# Patient Record
Sex: Female | Born: 1935 | Race: White | Hispanic: No | Marital: Married
Health system: Southern US, Community
[De-identification: ages and names within clinical notes are randomized; demographics above are authoritative.]

## PROBLEM LIST (undated history)

## (undated) DIAGNOSIS — E559 Vitamin D deficiency, unspecified: Secondary | ICD-10-CM

## (undated) DIAGNOSIS — R32 Unspecified urinary incontinence: Secondary | ICD-10-CM

## (undated) DIAGNOSIS — T7840XA Allergy, unspecified, initial encounter: Secondary | ICD-10-CM

## (undated) DIAGNOSIS — E538 Deficiency of other specified B group vitamins: Secondary | ICD-10-CM

## (undated) DIAGNOSIS — F419 Anxiety disorder, unspecified: Secondary | ICD-10-CM

## (undated) DIAGNOSIS — F329 Major depressive disorder, single episode, unspecified: Secondary | ICD-10-CM

## (undated) DIAGNOSIS — I10 Essential (primary) hypertension: Secondary | ICD-10-CM

## (undated) DIAGNOSIS — F32A Depression, unspecified: Secondary | ICD-10-CM

## (undated) DIAGNOSIS — W19XXXA Unspecified fall, initial encounter: Secondary | ICD-10-CM

## (undated) HISTORY — DX: Allergy, unspecified, initial encounter: T78.40XA

## (undated) HISTORY — DX: Vitamin D deficiency, unspecified: E55.9

## (undated) HISTORY — DX: Deficiency of other specified B group vitamins: E53.8

## (undated) HISTORY — DX: Anxiety disorder, unspecified: F41.9

## (undated) HISTORY — DX: Major depressive disorder, single episode, unspecified: F32.9

## (undated) HISTORY — DX: Essential (primary) hypertension: I10

## (undated) HISTORY — DX: Unspecified fall, initial encounter: W19.XXXA

## (undated) HISTORY — DX: Depression, unspecified: F32.A

## (undated) HISTORY — DX: Unspecified urinary incontinence: R32

---

## 2002-12-25 ENCOUNTER — Other Ambulatory Visit: Payer: Self-pay

## 2004-03-25 ENCOUNTER — Ambulatory Visit: Payer: Self-pay | Admitting: Physician Assistant

## 2004-06-10 ENCOUNTER — Ambulatory Visit: Payer: Self-pay | Admitting: Internal Medicine

## 2005-06-23 ENCOUNTER — Ambulatory Visit: Payer: Self-pay | Admitting: Internal Medicine

## 2005-06-29 ENCOUNTER — Inpatient Hospital Stay: Payer: Self-pay | Admitting: General Practice

## 2005-06-29 ENCOUNTER — Other Ambulatory Visit: Payer: Self-pay

## 2005-07-02 ENCOUNTER — Other Ambulatory Visit: Payer: Self-pay

## 2005-07-16 ENCOUNTER — Emergency Department: Payer: Self-pay | Admitting: Emergency Medicine

## 2005-07-16 ENCOUNTER — Other Ambulatory Visit: Payer: Self-pay

## 2006-06-26 ENCOUNTER — Ambulatory Visit: Payer: Self-pay | Admitting: Internal Medicine

## 2007-06-28 ENCOUNTER — Ambulatory Visit: Payer: Self-pay | Admitting: Internal Medicine

## 2008-03-30 ENCOUNTER — Inpatient Hospital Stay: Payer: Self-pay | Admitting: Internal Medicine

## 2008-04-07 ENCOUNTER — Encounter: Payer: Self-pay | Admitting: Internal Medicine

## 2008-04-14 ENCOUNTER — Encounter: Payer: Self-pay | Admitting: Internal Medicine

## 2009-06-28 ENCOUNTER — Emergency Department: Payer: Self-pay | Admitting: Unknown Physician Specialty

## 2010-08-18 ENCOUNTER — Emergency Department: Payer: Self-pay | Admitting: Internal Medicine

## 2011-02-15 ENCOUNTER — Emergency Department: Payer: Self-pay | Admitting: Internal Medicine

## 2011-02-15 LAB — URINALYSIS, COMPLETE
Blood: NEGATIVE
Nitrite: NEGATIVE
Ph: 5 (ref 4.5–8.0)
Protein: NEGATIVE
RBC,UR: 3 /HPF (ref 0–5)
Specific Gravity: 1.021 (ref 1.003–1.030)
Squamous Epithelial: 1
WBC UR: 3 /HPF (ref 0–5)

## 2011-02-15 LAB — CK TOTAL AND CKMB (NOT AT ARMC): CK-MB: <0.5 ng/mL — ABNORMAL LOW (ref 0.5–3.6)

## 2011-02-15 LAB — COMPREHENSIVE METABOLIC PANEL
Albumin: 3.3 g/dL — ABNORMAL LOW (ref 3.4–5.0)
Alkaline Phosphatase: 71 U/L (ref 50–136)
BUN: 13 mg/dL (ref 7–18)
Calcium, Total: 8 mg/dL — ABNORMAL LOW (ref 8.5–10.1)
EGFR (Non-African Amer.): >60
Glucose: 111 mg/dL — ABNORMAL HIGH (ref 65–99)
Osmolality: 284 (ref 275–301)
Potassium: 3.8 mmol/L (ref 3.5–5.1)
Sodium: 142 mmol/L (ref 136–145)
Total Protein: 6.1 g/dL — ABNORMAL LOW (ref 6.4–8.2)

## 2011-02-15 LAB — CBC
HCT: 35.1 % (ref 35.0–47.0)
HGB: 11.9 g/dL — ABNORMAL LOW (ref 12.0–16.0)
MCHC: 33.9 g/dL (ref 32.0–36.0)
Platelet: 122 10*3/uL — ABNORMAL LOW (ref 150–440)
RBC: 3.55 10*6/uL — ABNORMAL LOW (ref 3.80–5.20)
RDW: 13.6 % (ref 11.5–14.5)

## 2011-02-20 LAB — CULTURE, BLOOD (SINGLE)

## 2011-08-15 ENCOUNTER — Ambulatory Visit: Payer: Self-pay | Admitting: Internal Medicine

## 2011-08-16 ENCOUNTER — Inpatient Hospital Stay: Payer: Self-pay | Admitting: Internal Medicine

## 2011-08-16 LAB — COMPREHENSIVE METABOLIC PANEL
Albumin: 3 g/dL — ABNORMAL LOW (ref 3.4–5.0)
Alkaline Phosphatase: 86 U/L (ref 50–136)
Anion Gap: 8 (ref 7–16)
BUN: 23 mg/dL — ABNORMAL HIGH (ref 7–18)
Bilirubin,Total: 0.8 mg/dL (ref 0.2–1.0)
Calcium, Total: 8.7 mg/dL (ref 8.5–10.1)
Chloride: 98 mmol/L (ref 98–107)
Co2: 26 mmol/L (ref 21–32)
Creatinine: 0.93 mg/dL (ref 0.60–1.30)
EGFR (African American): >60
EGFR (Non-African Amer.): 54 — ABNORMAL LOW
Glucose: 111 mg/dL — ABNORMAL HIGH (ref 65–99)
Osmolality: 269 (ref 275–301)
Potassium: 4.2 mmol/L (ref 3.5–5.1)
SGOT(AST): 13 U/L — ABNORMAL LOW (ref 15–37)
SGPT (ALT): 13 U/L
Sodium: 132 mmol/L — ABNORMAL LOW (ref 136–145)
Total Protein: 6.6 g/dL (ref 6.4–8.2)

## 2011-08-16 LAB — URINALYSIS, COMPLETE
Bacteria: NONE SEEN
Bilirubin,UR: NEGATIVE
Glucose,UR: NEGATIVE mg/dL (ref 0–75)
Granular Cast: 13
Nitrite: NEGATIVE
Ph: 5 (ref 4.5–8.0)
Protein: 30
RBC,UR: 6 /HPF (ref 0–5)
Specific Gravity: 1.018 (ref 1.003–1.030)
Squamous Epithelial: 1
WBC UR: 10 /HPF (ref 0–5)

## 2011-08-16 LAB — CBC
HCT: 38.1 % (ref 35.0–47.0)
HGB: 12.8 g/dL (ref 12.0–16.0)
MCH: 33.6 pg (ref 26.0–34.0)
MCHC: 33.6 g/dL (ref 32.0–36.0)
MCV: 100 fL (ref 80–100)
Platelet: 159 10*3/uL (ref 150–440)
RBC: 3.82 10*6/uL (ref 3.80–5.20)
RDW: 13.9 % (ref 11.5–14.5)
WBC: 16.5 10*3/uL — ABNORMAL HIGH (ref 3.6–11.0)

## 2011-08-16 LAB — TROPONIN I: Troponin-I: 0.21 ng/mL — ABNORMAL HIGH

## 2011-08-17 LAB — COMPREHENSIVE METABOLIC PANEL
Alkaline Phosphatase: 77 U/L (ref 50–136)
Bilirubin,Total: 0.4 mg/dL (ref 0.2–1.0)
Calcium, Total: 8.4 mg/dL — ABNORMAL LOW (ref 8.5–10.1)
Chloride: 99 mmol/L (ref 98–107)
Co2: 27 mmol/L (ref 21–32)
Creatinine: 0.94 mg/dL (ref 0.60–1.30)
EGFR (African American): >60
Glucose: 122 mg/dL — ABNORMAL HIGH (ref 65–99)
Osmolality: 276 (ref 275–301)
Potassium: 4 mmol/L (ref 3.5–5.1)
SGOT(AST): 15 U/L (ref 15–37)
SGPT (ALT): 12 U/L
Sodium: 134 mmol/L — ABNORMAL LOW (ref 136–145)

## 2011-08-17 LAB — CBC WITH DIFFERENTIAL/PLATELET
Basophil %: 0.1 %
Eosinophil #: 0 10*3/uL (ref 0.0–0.7)
Eosinophil %: 0 %
HCT: 36 % (ref 35.0–47.0)
HGB: 12.1 g/dL (ref 12.0–16.0)
Lymphocyte #: 0.7 10*3/uL — ABNORMAL LOW (ref 1.0–3.6)
MCV: 99 fL (ref 80–100)
Monocyte %: 5.1 %
Neutrophil #: 11.9 10*3/uL — ABNORMAL HIGH (ref 1.4–6.5)
Platelet: 151 10*3/uL (ref 150–440)
RBC: 3.63 10*6/uL — ABNORMAL LOW (ref 3.80–5.20)
WBC: 13.2 10*3/uL — ABNORMAL HIGH (ref 3.6–11.0)

## 2011-08-22 LAB — CULTURE, BLOOD (SINGLE)

## 2011-09-15 ENCOUNTER — Ambulatory Visit: Payer: Self-pay | Admitting: Internal Medicine

## 2011-12-07 ENCOUNTER — Emergency Department: Payer: Self-pay | Admitting: Emergency Medicine

## 2012-05-18 ENCOUNTER — Encounter: Payer: Self-pay | Admitting: Nurse Practitioner

## 2012-05-18 DIAGNOSIS — F411 Generalized anxiety disorder: Secondary | ICD-10-CM

## 2012-09-05 ENCOUNTER — Non-Acute Institutional Stay (SKILLED_NURSING_FACILITY): Payer: Medicare Other | Admitting: Adult Health

## 2012-09-05 DIAGNOSIS — K219 Gastro-esophageal reflux disease without esophagitis: Secondary | ICD-10-CM

## 2012-09-05 DIAGNOSIS — J309 Allergic rhinitis, unspecified: Secondary | ICD-10-CM

## 2012-09-05 DIAGNOSIS — I10 Essential (primary) hypertension: Secondary | ICD-10-CM

## 2012-09-05 DIAGNOSIS — F329 Major depressive disorder, single episode, unspecified: Secondary | ICD-10-CM

## 2012-09-05 DIAGNOSIS — F341 Dysthymic disorder: Secondary | ICD-10-CM

## 2012-09-05 DIAGNOSIS — R32 Unspecified urinary incontinence: Secondary | ICD-10-CM

## 2012-09-05 DIAGNOSIS — E538 Deficiency of other specified B group vitamins: Secondary | ICD-10-CM

## 2012-09-05 DIAGNOSIS — K5900 Constipation, unspecified: Secondary | ICD-10-CM

## 2012-09-05 DIAGNOSIS — E559 Vitamin D deficiency, unspecified: Secondary | ICD-10-CM

## 2012-09-05 DIAGNOSIS — W19XXXS Unspecified fall, sequela: Secondary | ICD-10-CM

## 2012-09-07 ENCOUNTER — Encounter: Payer: Self-pay | Admitting: Adult Health

## 2012-09-07 DIAGNOSIS — J309 Allergic rhinitis, unspecified: Secondary | ICD-10-CM | POA: Insufficient documentation

## 2012-09-07 DIAGNOSIS — W19XXXA Unspecified fall, initial encounter: Secondary | ICD-10-CM | POA: Insufficient documentation

## 2012-09-07 DIAGNOSIS — E538 Deficiency of other specified B group vitamins: Secondary | ICD-10-CM | POA: Insufficient documentation

## 2012-09-07 DIAGNOSIS — K219 Gastro-esophageal reflux disease without esophagitis: Secondary | ICD-10-CM | POA: Insufficient documentation

## 2012-09-07 DIAGNOSIS — E559 Vitamin D deficiency, unspecified: Secondary | ICD-10-CM | POA: Insufficient documentation

## 2012-09-07 DIAGNOSIS — K5900 Constipation, unspecified: Secondary | ICD-10-CM | POA: Insufficient documentation

## 2012-09-07 DIAGNOSIS — I10 Essential (primary) hypertension: Secondary | ICD-10-CM | POA: Insufficient documentation

## 2012-09-07 DIAGNOSIS — F419 Anxiety disorder, unspecified: Secondary | ICD-10-CM | POA: Insufficient documentation

## 2012-09-07 DIAGNOSIS — R32 Unspecified urinary incontinence: Secondary | ICD-10-CM | POA: Insufficient documentation

## 2012-09-07 MED ORDER — VITAMIN D 50 MCG (2000 UT) PO CAPS: 2000.0000 [IU] | ORAL_CAPSULE | Freq: Every day | ORAL | Status: DC

## 2012-09-07 MED ORDER — CYANOCOBALAMIN 1000 MCG/ML IJ SOLN: 1000.0000 ug | Freq: Once | INTRAMUSCULAR | Status: DC

## 2012-09-07 NOTE — Progress Notes (Signed)
Patient ID: Joann Mccoy, female   DOB: 10-11-1918, 79 y.o.   MRN: [id number]  [name]  No Known Allergies   Chief Complaint  Patient presents with  . Medical Managment of Chronic Issues    HPI: She is being seen for the management of her chronic illnesses. She did sustain a fall today in which she suffered a skin tear on her left forehead. She is unable to fully participate in the hpi or ros. Otherwise staff reports that she is without any significant change in her overall status.   Past Medical History  Diagnosis Date  . Hypertension   . Allergy   . Anxiety   . Depression   . Urinary incontinence   . Vitamin D deficiency   . Vitamin B12 deficiency   . Fall     No past surgical history on file.  VITAL SIGNS BP 148/58  Pulse 60  Ht 5\' 5"  (1.651 m)  Wt 107 lb 3.2 oz (48.626 kg)  BMI 17.84 kg/m2   Patient's Medications  New Prescriptions   No medications on file  Previous Medications   ACETAMINOPHEN (TYLENOL) 325 MG TABLET    Take 650 mg by mouth at bedtime.   ATENOLOL (TENORMIN) 25 MG TABLET    Take 25 mg by mouth daily.   BUSPIRONE (BUSPAR) 7.5 MG TABLET    Take 7.5 mg by mouth 2 (two) times daily.   CITALOPRAM (CELEXA) 20 MG TABLET    Take 20 mg by mouth daily.   FLUTICASONE (FLONASE) 50 MCG/ACT NASAL SPRAY    Place 2 sprays into the nose daily.   LORAZEPAM (ATIVAN) 0.5 MG TABLET    Take 0.5 mg by mouth 2 (two) times daily as needed for anxiety.   OMEPRAZOLE (PRILOSEC) 20 MG CAPSULE    Take 20 mg by mouth daily.   OXYBUTYNIN (DITROPAN) 5 MG TABLET    Take 2.5 mg by mouth daily.   SENNOSIDES-DOCUSATE SODIUM (SENOKOT-S) 8.6-50 MG TABLET    Take 1 tablet by mouth daily.  Modified Medications   No medications on file  Discontinued Medications   No medications on file    SIGNIFICANT DIAGNOSTIC EXAMS  05-22-12: wbc 4.8; hgb 11.1; hct 33.8; mcv 97.4; plt 174; glucose 76; bun 11; creat 0.76; k+ 4.7;  Na++139; tsh 2.538; vit b12: 209; vit d 27    Review of  Systems  Unable to perform ROS   Physical Exam  Constitutional:  thin  Neck: Neck supple. No JVD present.  Cardiovascular: Normal rate and regular rhythm.   Respiratory: Effort normal and breath sounds normal. No respiratory distress.  GI: Soft. Bowel sounds are normal. She exhibits no distension. There is no tenderness.  Musculoskeletal: She exhibits no edema.  Is out of bed to wheelchair  Neurological: She is alert.  Skin: Skin is warm and dry.      ASSESSMENT/ PLAN:  GERD (gastroesophageal reflux disease) Will continue prilosec 20 mg daily   Anxiety and depression Is stable will continue celexa 20 mg daily; will continue buspar 7.5 mg twice daily; will continue ativan 0.5 mg twice daily as needed and will monitor   Urinary incontinence Will stop the ditropan due to her fall; her dose not therapeutic this medication could be contributing to her fall.   Vitamin B12 deficiency Her vitamin b12 level is 209; will begin vitamin b12 1000 mcg injection monthly and will monitor  Unspecified vitamin D deficiency Her vitamin d level is 27 will begin vit d 2000  units daily and will monitor   Rhinitis, allergic Will continue flonase 2 puffs daily and will monitor   Unspecified constipation Will continue senna s daily and will monitor  Essential hypertension, benign Will continue her atenolol 25 mg daily and will monitor her status   Fall She did sustain a fall today; has a skin tear on her left forehead. Will stop her ditropan as this could contribute to her fall. Will begin her vit b and vit d as these low levels could also contribute to falls.    Time spent with patient 50 minutes

## 2012-09-07 NOTE — Assessment & Plan Note (Signed)
She did sustain a fall today; has a skin tear on her left forehead. Will stop her ditropan as this could contribute to her fall. Will begin her vit b and vit d as these low levels could also contribute to falls.

## 2012-09-07 NOTE — Assessment & Plan Note (Signed)
Will continue her atenolol 25 mg daily and will monitor her status

## 2012-09-07 NOTE — Assessment & Plan Note (Signed)
Is stable will continue celexa 20 mg daily; will continue buspar 7.5 mg twice daily; will continue ativan 0.5 mg twice daily as needed and will monitor

## 2012-09-07 NOTE — Assessment & Plan Note (Signed)
Her vitamin b12 level is 209; will begin vitamin b12 1000 mcg injection monthly and will monitor

## 2012-09-07 NOTE — Assessment & Plan Note (Signed)
Her vitamin d level is 27 will begin vit d 2000 units daily and will monitor

## 2012-09-07 NOTE — Assessment & Plan Note (Signed)
Will continue senna s daily and will monitor

## 2012-09-07 NOTE — Assessment & Plan Note (Signed)
Will continue prilosec 20 mg daily  

## 2012-09-07 NOTE — Assessment & Plan Note (Signed)
Will stop the ditropan due to her fall; her dose not therapeutic this medication could be contributing to her fall.

## 2012-09-07 NOTE — Assessment & Plan Note (Signed)
Will continue flonase 2 puffs daily and will monitor

## 2012-09-26 ENCOUNTER — Non-Acute Institutional Stay (SKILLED_NURSING_FACILITY): Payer: Medicare Other | Admitting: Nurse Practitioner

## 2012-09-26 DIAGNOSIS — K5900 Constipation, unspecified: Secondary | ICD-10-CM

## 2012-09-26 DIAGNOSIS — I10 Essential (primary) hypertension: Secondary | ICD-10-CM

## 2012-09-26 DIAGNOSIS — R32 Unspecified urinary incontinence: Secondary | ICD-10-CM

## 2012-09-26 DIAGNOSIS — K219 Gastro-esophageal reflux disease without esophagitis: Secondary | ICD-10-CM

## 2012-09-26 DIAGNOSIS — F341 Dysthymic disorder: Secondary | ICD-10-CM

## 2012-09-26 DIAGNOSIS — F329 Major depressive disorder, single episode, unspecified: Secondary | ICD-10-CM

## 2012-10-09 NOTE — Progress Notes (Signed)
This encounter was created in error - please disregard.

## 2012-10-21 IMAGING — CT CT HEAD WITHOUT CONTRAST
2 series · 15 of 30 positions shown, 19 images · non-contrast
Comparison: none

REASON FOR EXAM: AMS,fever
COMMENTS:   LMP: Post-Menopausal

PROCEDURE:     CT  - CT HEAD WITHOUT CONTRAST  - August 16, 2011  [DATE]
RESULT:     Head CT dated 08/16/2011.
TECHNIQUE: Helical noncontrasted 5 mm sections were obtained from the skull
base to the vertex. This study was compared to previous study dated 08/18/2010.

[Series 2: without · axial · non-contrast · 0.43mm/px · z∈[+330,+470]mm · 13 of 34 slices shown, 17 images]
[im 3/34  brain]
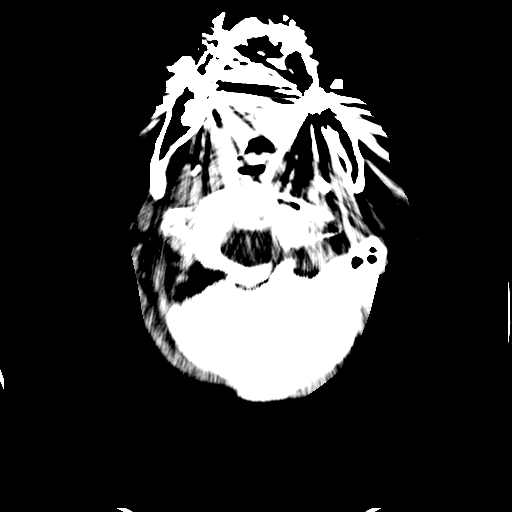
[im 3/34  bone]
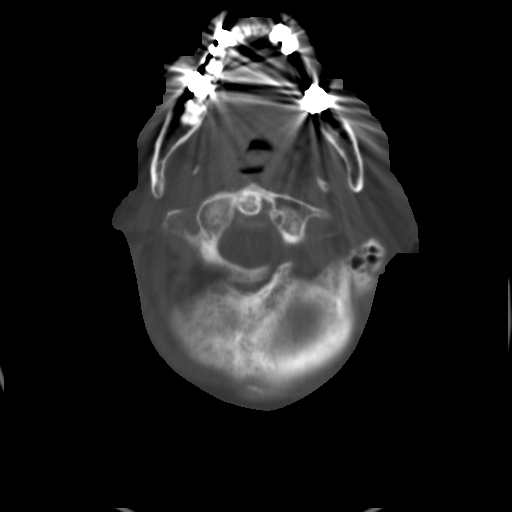
[im 5/34  brain]
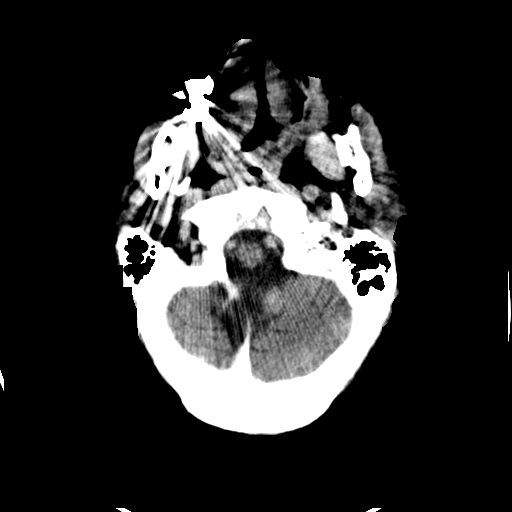
[im 8/34  brain]
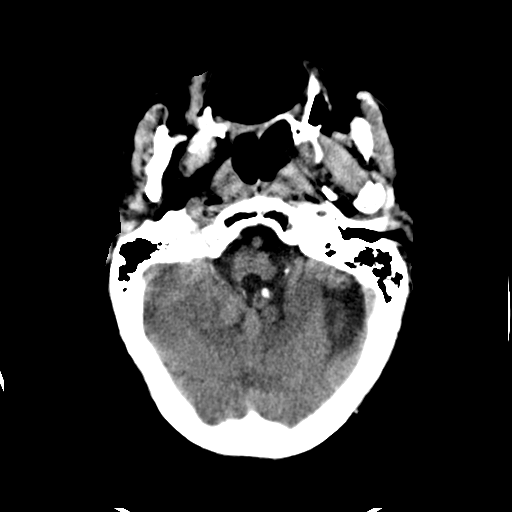
[im 10/34  brain]
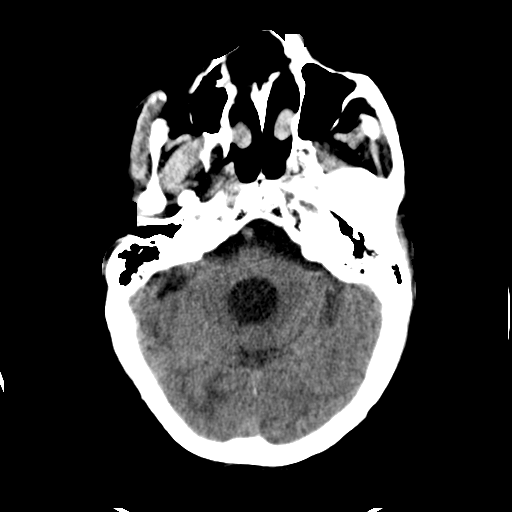
[im 12/34  brain]
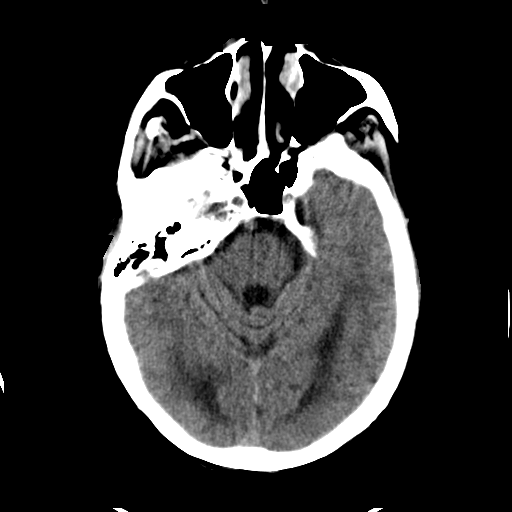
[im 12/34  bone]
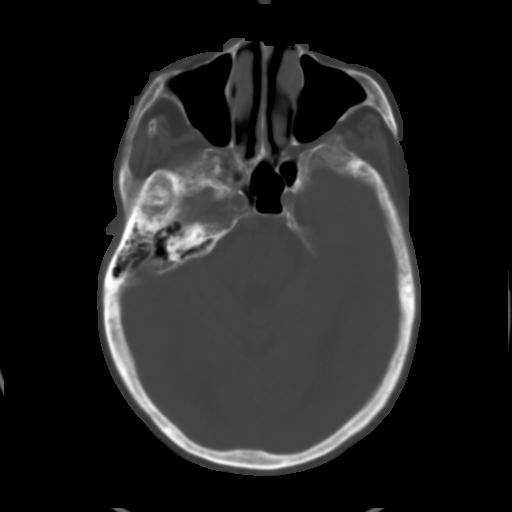
[im 15/34  brain]
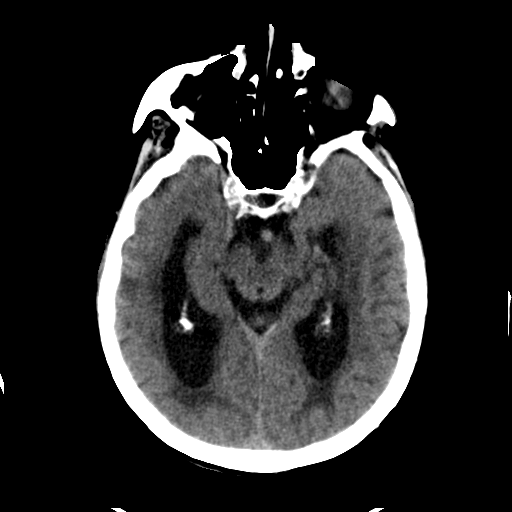
[im 17/34  brain]
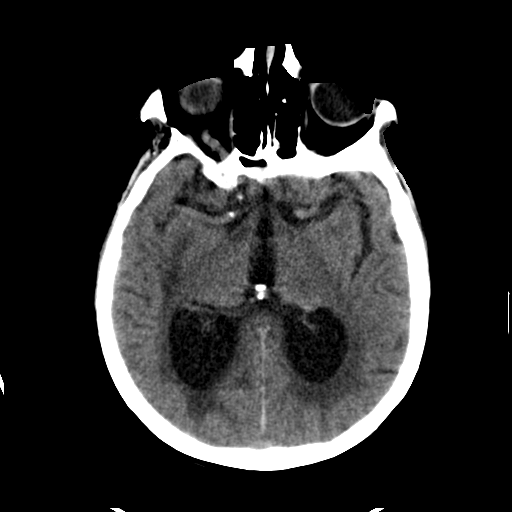
[im 19/34  brain]
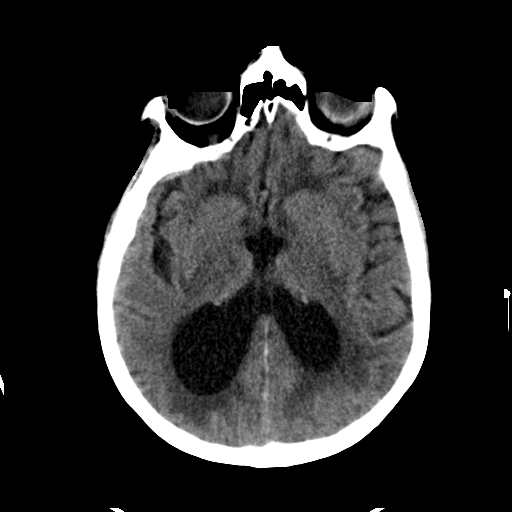
[im 22/34  brain]
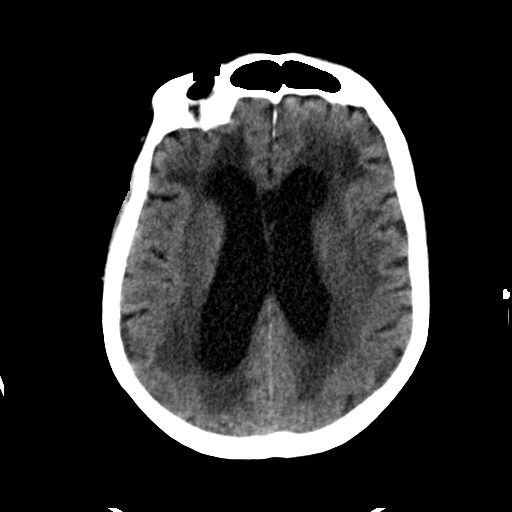
[im 22/34  bone]
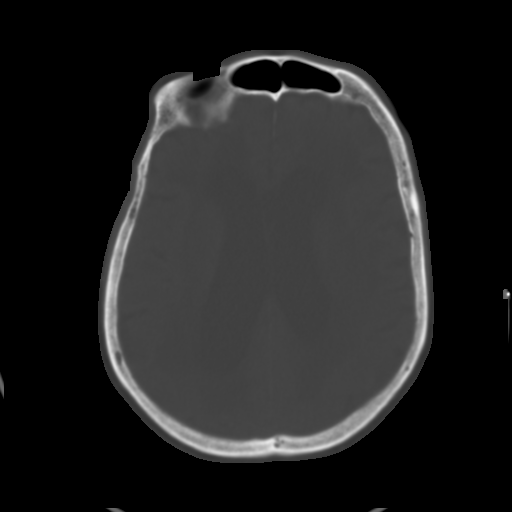
[im 24/34  brain]
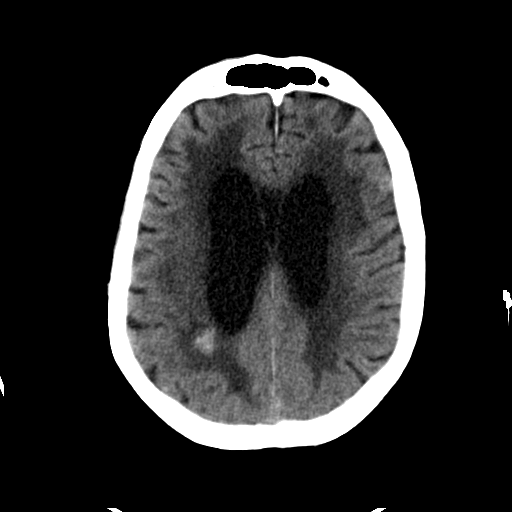
[im 26/34  brain]
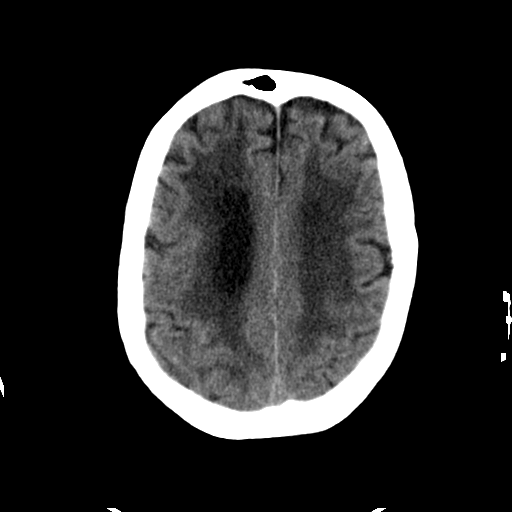
[im 29/34  brain]
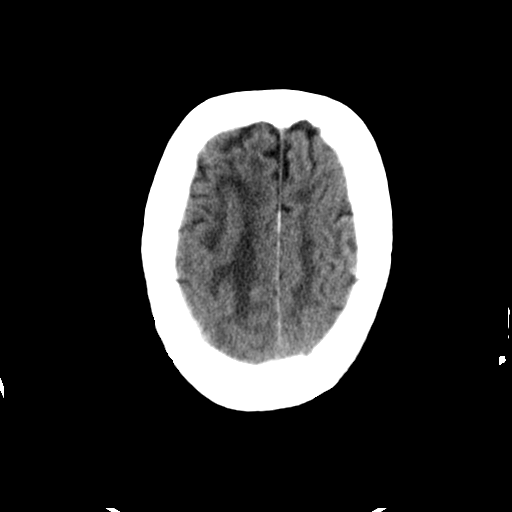
[im 31/34  brain]
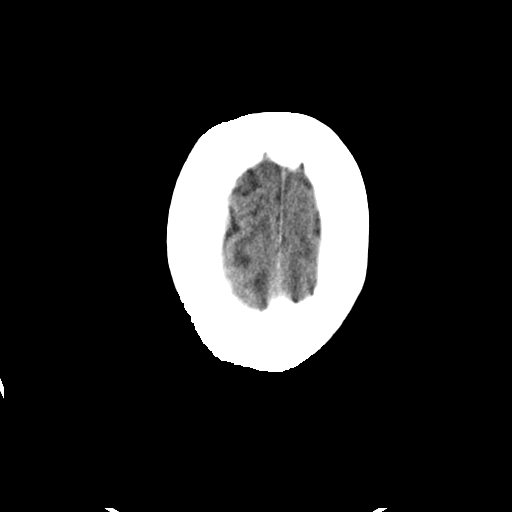
[im 31/34  bone]
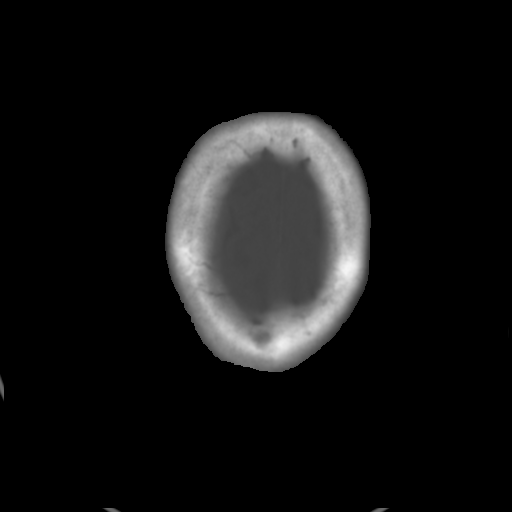

[Series 3: bone · axial · 0.43mm/px · z∈[+330,+354]mm · 2 of 34 slices shown]
[im 3/34  bone]
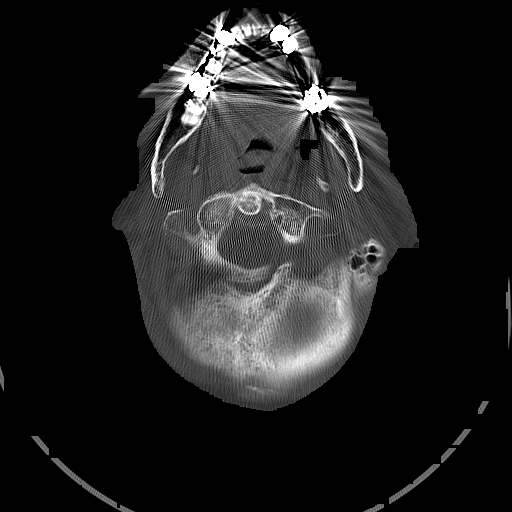
[im 8/34  bone]
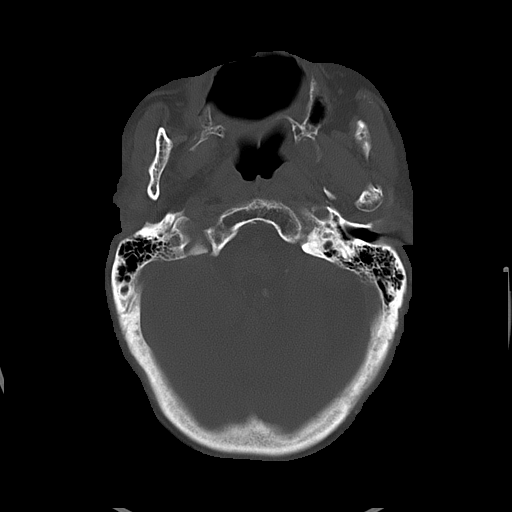

[15 of 30 positions shown; findings below may reference images not displayed]

FINDINGS: A small wedge-shaped area of increased density projects along the
posterior apex of the right lateral ventricle measuring 1.161 0.9 cm. This
finding has the appearance of a small area of intraparenchymal hemorrhage.

Severe areas of low-attenuation project within the subcortical, deep, and
periventricular white matter regions. There is diffuse cortical atrophy
hydrocephalus ex vacuo is identified. There is no evidence of mass effect,
intra-axial nor extra-axial fluid collections nor a depressed skull
fracture. Visualized paranasal sinuses are aerated as well as the mastoid
air cells.
IMPRESSION: CT findings consistent with a small punctate focus of
intraparenchymal hemorrhage along the posterior aspect of the apex of the
right lateral ventricle.
2. Severe small vessel white matter ischemic changes as well as involutional
changes.
3. Guy Thierry Puy FNP of the emergency department was informed of these
findings at time of initial interpretation on 08/16/2011 at [DATE] p.m. EDT.

## 2013-02-04 ENCOUNTER — Non-Acute Institutional Stay (SKILLED_NURSING_FACILITY): Payer: Medicare Other | Admitting: Adult Health

## 2013-02-04 DIAGNOSIS — I10 Essential (primary) hypertension: Secondary | ICD-10-CM

## 2013-02-04 DIAGNOSIS — E559 Vitamin D deficiency, unspecified: Secondary | ICD-10-CM

## 2013-02-04 DIAGNOSIS — K5900 Constipation, unspecified: Secondary | ICD-10-CM

## 2013-02-04 DIAGNOSIS — K219 Gastro-esophageal reflux disease without esophagitis: Secondary | ICD-10-CM

## 2013-02-04 DIAGNOSIS — F341 Dysthymic disorder: Secondary | ICD-10-CM

## 2013-02-04 DIAGNOSIS — F329 Major depressive disorder, single episode, unspecified: Secondary | ICD-10-CM

## 2013-02-04 DIAGNOSIS — R32 Unspecified urinary incontinence: Secondary | ICD-10-CM

## 2013-02-04 DIAGNOSIS — E538 Deficiency of other specified B group vitamins: Secondary | ICD-10-CM

## 2013-02-04 DIAGNOSIS — J309 Allergic rhinitis, unspecified: Secondary | ICD-10-CM

## 2013-02-06 NOTE — Progress Notes (Signed)
Visit date 09/26/2012 [facility], room 405 B CODE STATUS DO NOT RESUSCITATE  Patient ID: Joann Mccoy, female   DOB: 1918/04/13, 79 y.o.   MRN: [id number]  No Known Allergies  Chief Complaint  Patient presents with  . Medical Managment of Chronic Issues   HPI: Patient is a 79 year old Caucasian female who is being seen today for medical management of her chronic illnesses, which include hypertension, GERD, vitamin B12 in the deficiency, depression.  Review of Systems: Review of systems is difficult related to patient's limited communication ability Cardiac, no complaint of chest pain Respiratory, no new shortness of breath Skin, no report of any new bruising or bleeding. Psychiatric, staff reports that the patient is having less yelling out with her current regimen of Celexa and BuSpar  Past Medical History  Diagnosis Date  . Hypertension   . Allergy   . Anxiety   . Depression   . Urinary incontinence   . Vitamin D deficiency   . Vitamin B12 deficiency   . Fall     No past surgical history on file.  Current Outpatient Prescriptions on File Prior to Visit  Medication Sig Dispense Refill  . acetaminophen (TYLENOL) 325 MG tablet Take 650 mg by mouth at bedtime.      Marland Kitchen atenolol (TENORMIN) 25 MG tablet Take 25 mg by mouth daily.      . busPIRone (BUSPAR) 7.5 MG tablet Take 7.5 mg by mouth 2 (two) times daily.      . Cholecalciferol (VITAMIN D) 2000 UNITS CAPS Take 1 capsule (2,000 Units total) by mouth daily.  30 capsule  99  . citalopram (CELEXA) 20 MG tablet Take 20 mg by mouth daily.      . cyanocobalamin (,VITAMIN B-12,) 1000 MCG/ML injection Inject 1 mL (1,000 mcg total) into the muscle once.  1 mL  0  . fluticasone (FLONASE) 50 MCG/ACT nasal spray Place 2 sprays into the nose daily.      Marland Kitchen LORazepam (ATIVAN) 0.5 MG tablet Take 0.5 mg by mouth 2 (two) times daily as needed for anxiety.      Marland Kitchen omeprazole (PRILOSEC) 20 MG capsule Take 20 mg by mouth daily.      Marland Kitchen  oxybutynin (DITROPAN) 5 MG tablet Take 2.5 mg by mouth daily.      . sennosides-docusate sodium (SENOKOT-S) 8.6-50 MG tablet Take 1 tablet by mouth daily.       No current facility-administered medications on file prior to visit.   Labs, from 05/22/2012 include hemoglobin and hematocrit 11.1 and 33.8 Creatinine 0.76 Vitamin B12 209 Vitamin D 27 TSH 2.538 Patient had positive urine cultures in April of 2014 and January 2014, both positive for Escherichia coli  BP 162/57  Pulse 61  Temp(Src) 98.8 F (37.1 C)  Resp 16  Physical Examination: Patient with a flat affect, but is alert, verbally interactive but with some limited ability to communicate. She certainly oriented to person and place. Pupils are equally round and reactive to light. No eye discharge or redness of sclera evidenced Ears unremarkable.  Oral mucosa moist and pink, no apparent lesion, posterior pharynx without erythema. Neck, no thyromegaly or palpable adenopathy Ccardiovascular, regular rate and rhythm, positive pedal dorsalis pulses, minimal lower extremity edema.  Respiratory easy and unlabored, no adventitious breath sounds appreciated. Gastrointestinal, positive bowel sounds throughout, no palpable masses or tenderness to palpation. Musculoskeletal, independent movement of all 4 extremities, nonambulatory, generalized weakness. No kyphosis evidenced.  Assessment/plan Recurrent urinary tract infections, okay to  culture urine if patient is symptomatic or temp is greater than 99.5 Hypertension, systolic pressure elevated, but diastolic flow. 25 mg of atenolol each day. Am reluctant to add any other medication as her diastolic pressure may be further decreased. We'll continue to monitor GERD we'll continue omeprazole 20 mg per day and continue to monitor Constipation, Senokot S. each day and ongoing assessment Osteoarthritis, stable, fall precautions, Tylenol as necessary, and continue to assess Anxiety/depression,  we'll continue the patient's Celexa 20 mg in the BuSpar 7.5 mg twice a day. We'll continue to monitor

## 2013-02-10 ENCOUNTER — Encounter: Payer: Self-pay | Admitting: Adult Health

## 2013-02-10 NOTE — Progress Notes (Signed)
Patient ID: Joann Mccoy, female   DOB: [birth year], 79 y.o.   MRN: [id number]     [facility]  No Known Allergies   Chief Complaint  Patient presents with  . Medical Managment of Chronic Issues    HPI:  She is being seen for the management of her chronic illnesses. There are no concerns being voiced by the nursing staff at this time. She is unable to fully participate in the hpi or ros. There is no significant change in her overall status   Past Medical History  Diagnosis Date  . Hypertension   . Allergy   . Anxiety   . Depression   . Urinary incontinence   . Vitamin D deficiency   . Vitamin B12 deficiency   . Fall     No past surgical history on file.  VITAL SIGNS BP 120/72  Pulse 88  Ht 5\' 5"  (1.651 m)  Wt 105 lb (47.628 kg)  BMI 17.47 kg/m2   Patient's Medications  New Prescriptions   No medications on file  Previous Medications   ACETAMINOPHEN (TYLENOL) 325 MG TABLET    Take 650 mg by mouth at bedtime.   ATENOLOL (TENORMIN) 25 MG TABLET    Take 25 mg by mouth daily.   CHOLECALCIFEROL (VITAMIN D) 2000 UNITS CAPS    Take 1 capsule (2,000 Units total) by mouth daily.   CITALOPRAM (CELEXA) 20 MG TABLET    Take 10 mg by mouth daily.    CYANOCOBALAMIN (,VITAMIN B-12,) 1000 MCG/ML INJECTION    Inject 1 mL (1,000 mcg total) into the muscle once.   FLUTICASONE (FLONASE) 50 MCG/ACT NASAL SPRAY    Place 2 sprays into the nose daily.   LORAZEPAM (ATIVAN) 0.5 MG TABLET    Take 0.5 mg by mouth 2 (two) times daily as needed for anxiety.   OMEPRAZOLE (PRILOSEC) 20 MG CAPSULE    Take 20 mg by mouth daily.   SENNOSIDES-DOCUSATE SODIUM (SENOKOT-S) 8.6-50 MG TABLET    Take 1 tablet by mouth daily.  Modified Medications   No medications on file  Discontinued Medications   BUSPIRONE (BUSPAR) 7.5 MG TABLET    Take 7.5 mg by mouth 2 (two) times daily.   OXYBUTYNIN (DITROPAN) 5 MG TABLET    Take 2.5 mg by mouth daily.    SIGNIFICANT DIAGNOSTIC EXAMS   05-22-12: wbc 4.8; hgb  11.1; hct 33.8; mcv 97.4; plt 174; glucose 76; bun 11; creat 0.76; k+ 4.7;  Na++139; tsh 2.538; vit b12: 209; vit d 27    Review of Systems  Unable to perform ROS   Physical Exam  Constitutional:  thin  Neck: Neck supple. No JVD present.  Cardiovascular: Normal rate and regular rhythm.   Respiratory: Effort normal and breath sounds normal. No respiratory distress.  GI: Soft. Bowel sounds are normal. She exhibits no distension. There is no tenderness.  Musculoskeletal: She exhibits no edema.  Is out of bed to wheelchair  Neurological: She is alert.  Skin: Skin is warm and dry.      ASSESSMENT/ PLAN:  GERD (gastroesophageal reflux disease) Will continue prilosec 20 mg daily   Anxiety and depression Is stable will continue celexa 10 mg daily; will continue ativan 0.5 mg twice daily as needed for anxiety and will monitor   Urinary incontinence No change in status in is not on medications will monitor    Vitamin B12 deficiency Will continue monthly vit b12 injection  and will monitor  Unspecified vitamin D deficiency  will continue  vit d 2000 units daily and will monitor   Rhinitis, allergic Will continue flonase 2 puffs daily and will monitor   Unspecified constipation Will continue senna s daily and will monitor  Essential hypertension, benign Will continue her atenolol 25 mg daily and will monitor her status

## 2013-02-11 IMAGING — CT CT HEAD WITHOUT CONTRAST
2 series · 15 of 30 positions shown, 19 images · non-contrast
Comparison: none

REASON FOR EXAM: fall with left temporal trauma
COMMENTS:

PROCEDURE:     CT  - CT HEAD WITHOUT CONTRAST  - December 07, 2011  [DATE]
RESULT:     Comparison:  08/16/2011, 08/18/2010
TECHNIQUE: Multiple axial images from the foramen magnum to the vertex were
obtained without IV contrast.

[Series 2: without · axial · non-contrast · 0.42mm/px · z∈[-65,+75]mm · 13 of 34 slices shown, 17 images]
[im 3/34  brain]
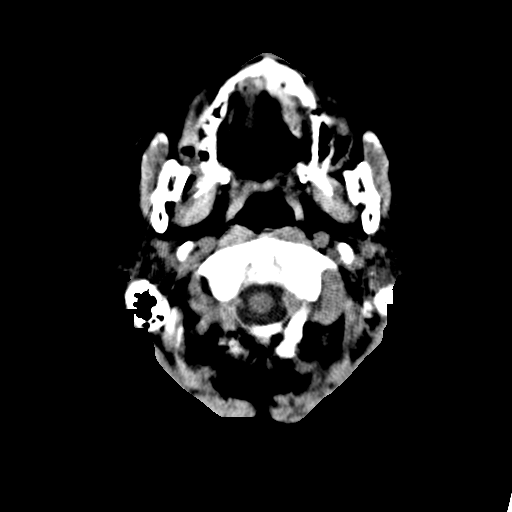
[im 3/34  bone]
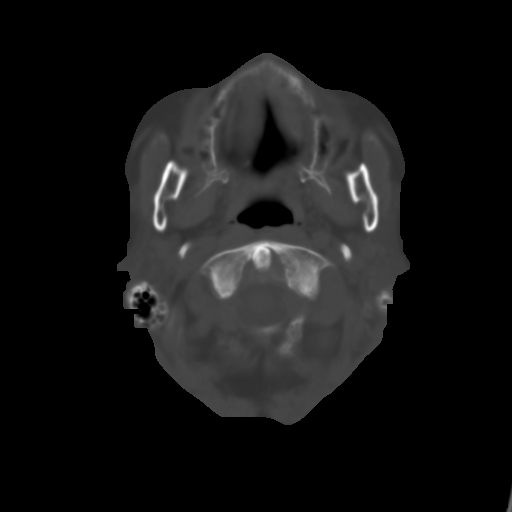
[im 5/34  brain]
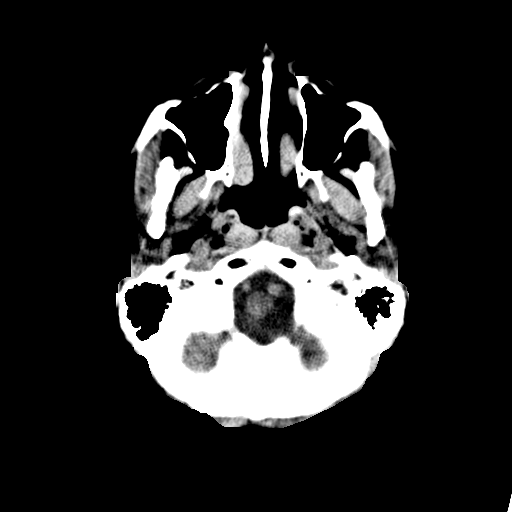
[im 8/34  brain]
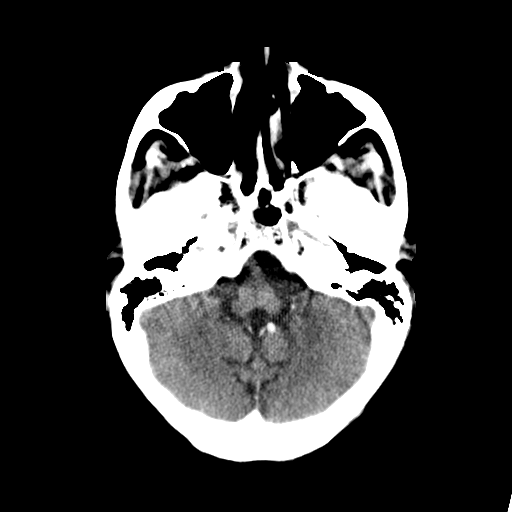
[im 10/34  brain]
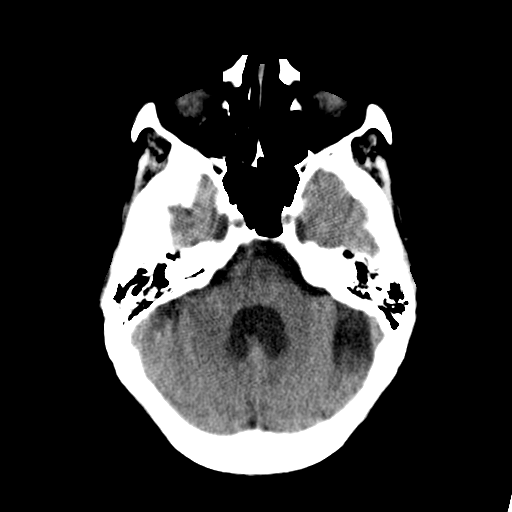
[im 12/34  brain]
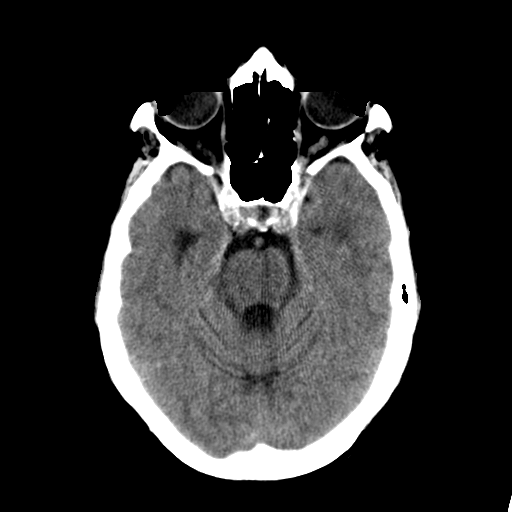
[im 12/34  bone]
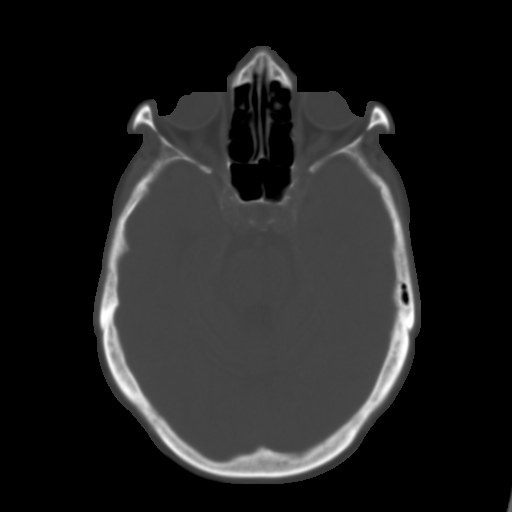
[im 15/34  brain]
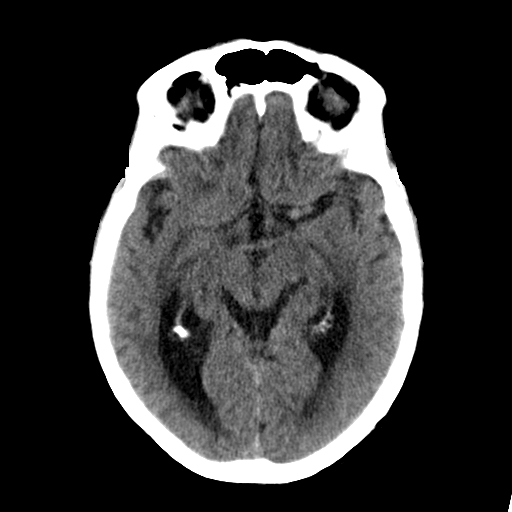
[im 17/34  brain]
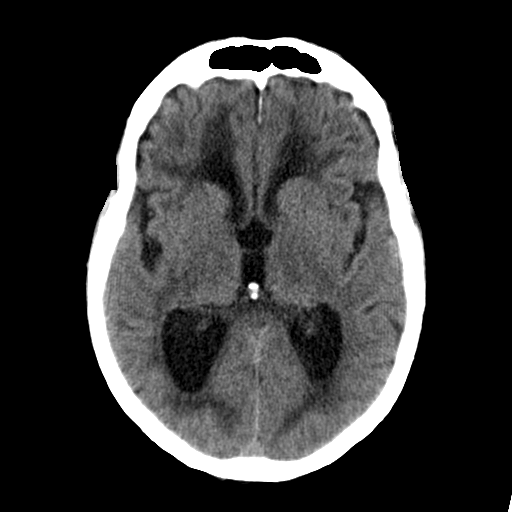
[im 19/34  brain]
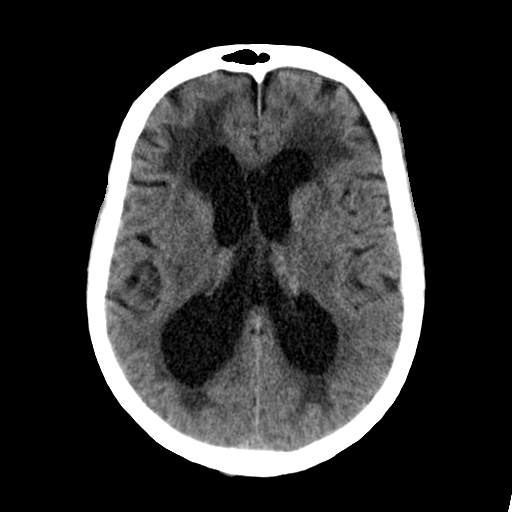
[im 22/34  brain]
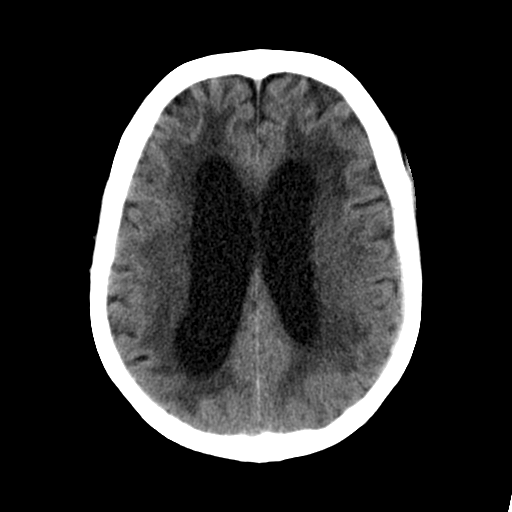
[im 22/34  bone]
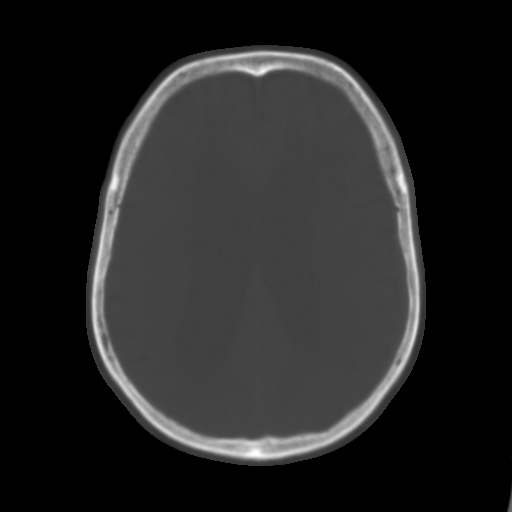
[im 24/34  brain]
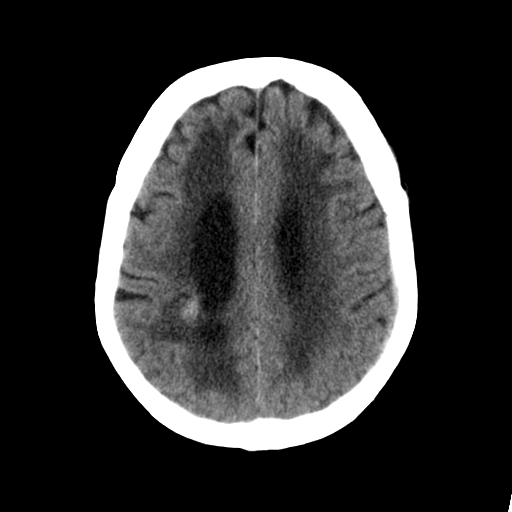
[im 26/34  brain]
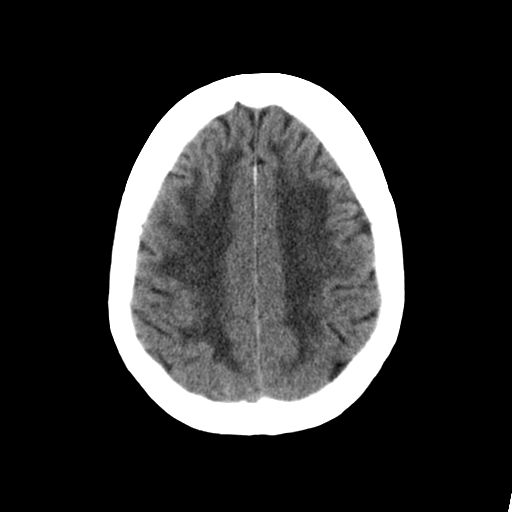
[im 29/34  brain]
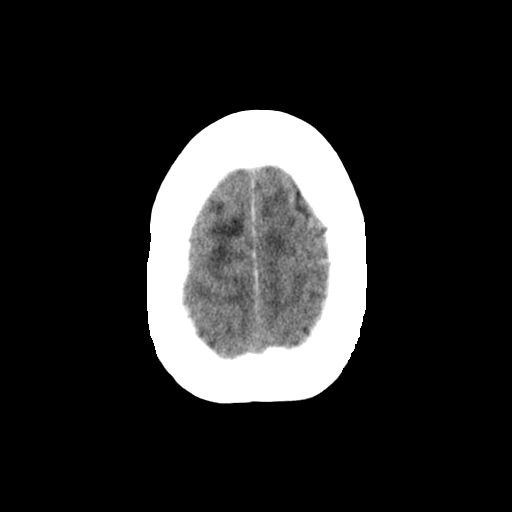
[im 31/34  brain]
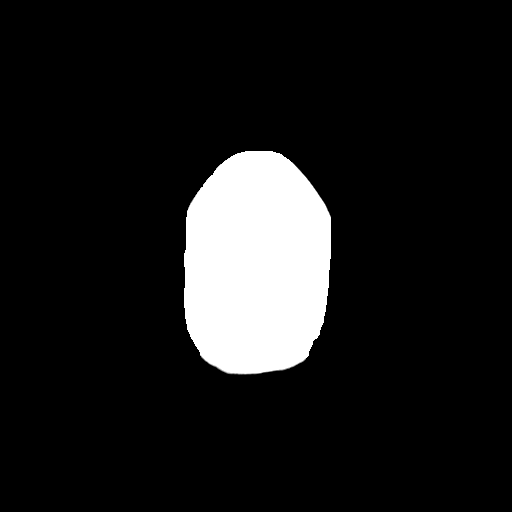
[im 31/34  bone]
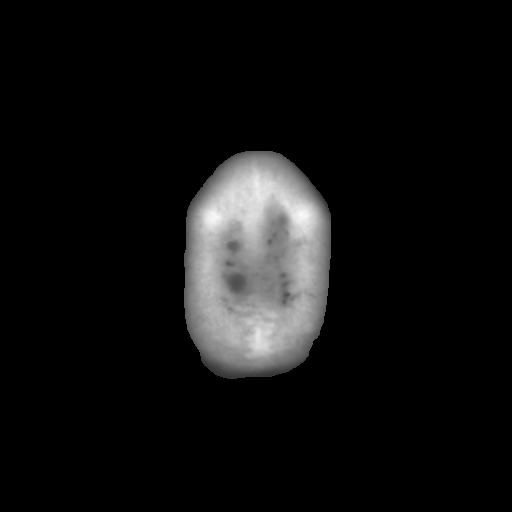

[Series 3: bone · axial · 0.42mm/px · z∈[-65,-40]mm · 2 of 34 slices shown]
[im 3/34  bone]
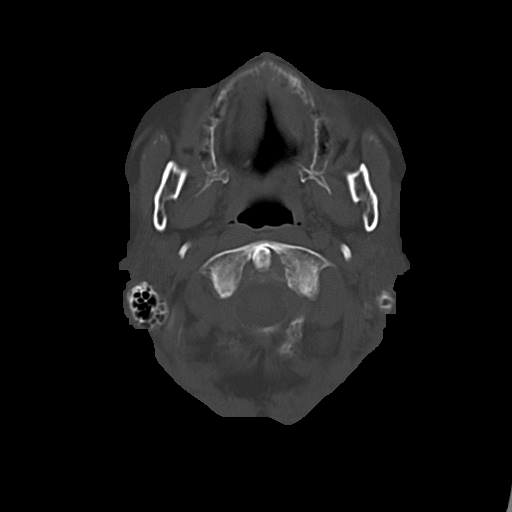
[im 8/34  bone]
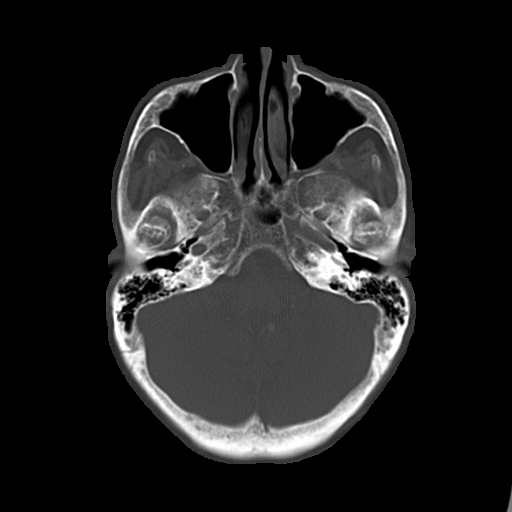

[15 of 30 positions shown; findings below may reference images not displayed]

FINDINGS: There is a 15 mm hyperdense area in the right parietal lobe white matter
which is unchanged compared with 08/18/2010. There is no evidence of mass
effect, midline shift, or extra-axial fluid collections.  There is no
evidence of an intracranial hemorrhage. There is no evidence of a
cortical-based area of acute infarction. There is generalized cerebral
atrophy. There is periventricular white matter low attenuation likely
secondary to microangiopathy.

The ventricles and sulci are appropriate for the patient's age. The basal
cisterns are patent.

Visualized portions of the orbits are unremarkable. The visualized portions
of the paranasal sinuses and mastoid air cells are unremarkable.
Cerebrovascular atherosclerotic calcifications are noted.

The osseous structures are unremarkable.
IMPRESSION: No acute intracranial process.

There is a 15 mm hyperdense area in the right parietal lobe white matter
which is unchanged compared with 08/18/2010. This may represent an area of
dystrophic calcification, but malignancy cannot be excluded. Recommend
further evaluation with MRI of the brain without and with intravenous
contra[REDACTED]

## 2013-03-07 ENCOUNTER — Non-Acute Institutional Stay (SKILLED_NURSING_FACILITY): Payer: Medicare Other | Admitting: Internal Medicine

## 2013-03-07 DIAGNOSIS — E538 Deficiency of other specified B group vitamins: Secondary | ICD-10-CM

## 2013-03-07 DIAGNOSIS — K5900 Constipation, unspecified: Secondary | ICD-10-CM

## 2013-03-07 DIAGNOSIS — R1319 Other dysphagia: Secondary | ICD-10-CM

## 2013-03-07 DIAGNOSIS — J309 Allergic rhinitis, unspecified: Secondary | ICD-10-CM

## 2013-03-07 DIAGNOSIS — F341 Dysthymic disorder: Secondary | ICD-10-CM

## 2013-03-07 DIAGNOSIS — F419 Anxiety disorder, unspecified: Secondary | ICD-10-CM

## 2013-03-07 DIAGNOSIS — I10 Essential (primary) hypertension: Secondary | ICD-10-CM

## 2013-03-07 DIAGNOSIS — F329 Major depressive disorder, single episode, unspecified: Secondary | ICD-10-CM

## 2013-03-07 DIAGNOSIS — E559 Vitamin D deficiency, unspecified: Secondary | ICD-10-CM

## 2013-03-07 NOTE — Progress Notes (Signed)
Patient ID: Joann Mccoy, female   DOB: 19-Aug-1918, 80 y.o.   MRN: [id number]    [facility]  Code Status: DNR  Allergies  Allergen Reactions  . Sulfa Antibiotics     Chief Complaint  Patient presents with  . Medical Managment of Chronic Issues    HPI:  80 y/o female pt is seen for routine visit. Her behavior is stable at present. No skin concern from staff. No falls reported. She is unable to participate in HPI and ROS. No other concern from staff.  Review of Systems:  Unable to obtain from pt  Past Medical History  Diagnosis Date  . Hypertension   . Allergy   . Anxiety   . Depression   . Urinary incontinence   . Vitamin D deficiency   . Vitamin B12 deficiency   . Fall    No past surgical history on file. Social History:   has no tobacco, alcohol, and drug history on file.  No family history on file.  Medications: Patient's Medications  New Prescriptions   No medications on file  Previous Medications   ACETAMINOPHEN (TYLENOL) 325 MG TABLET    Take 650 mg by mouth at bedtime.   ATENOLOL (TENORMIN) 25 MG TABLET    Take 25 mg by mouth daily.   CHOLECALCIFEROL (VITAMIN D) 2000 UNITS CAPS    Take 1 capsule (2,000 Units total) by mouth daily.   CITALOPRAM (CELEXA) 20 MG TABLET    Take 10 mg by mouth daily.    CYANOCOBALAMIN (,VITAMIN B-12,) 1000 MCG/ML INJECTION    Inject 1 mL (1,000 mcg total) into the muscle once.   FLUTICASONE (FLONASE) 50 MCG/ACT NASAL SPRAY    Place 2 sprays into the nose daily.   LORAZEPAM (ATIVAN) 0.5 MG TABLET    Take 0.5 mg by mouth 2 (two) times daily as needed for anxiety.   OMEPRAZOLE (PRILOSEC) 20 MG CAPSULE    Take 20 mg by mouth daily.   SENNOSIDES-DOCUSATE SODIUM (SENOKOT-S) 8.6-50 MG TABLET    Take 1 tablet by mouth daily.  Modified Medications   No medications on file  Discontinued Medications   No medications on file     Physical Exam: Filed Vitals:   03/07/13 1154  BP: 130/64  Pulse: 72  Temp: 98.2 F (36.8 C)   Resp: 18  SpO2: 95%   Constitutional:  thin and in no acute distress Neck: Neck supple. No JVD present. No enlarged thyroid gland Cardiovascular: Normal rate and regular rhythm.   Respiratory: Effort normal and breath sounds normal. No respiratory distress. No wheeze, has few rhonchi, no crackles GI: Soft. Bowel sounds are normal. She exhibits no distension. There is no tenderness.  Musculoskeletal: She exhibits no edema.  Is out of bed to wheelchair  Neurological: She is alert  Skin: Skin is warm and dry.    Labs reviewed: 05-22-12: wbc 4.8; hgb 11.1; hct 33.8; mcv 97.4; plt 174; glucose 76; bun 11; creat 0.76; k+ 4.7;   Na++139; tsh 2.538; vit b12: 209; vit d 27  12-03-12 wbc 4.6, hb 12.6, hct 39.1,na 136, k 4, bun 11, cr 0.7, lft wnl  Assessment/Plan  dysphagia Tolerating puree diet well. Aspiration precautions. Continue prilosec 20 mg daily  Essential hypertension, benign Tolerating atenolol 25 mg daily. Continue to monitor her status   Unspecified constipation continue senna s daily and will monitor  Anxiety and depression stable on celexa 10 mg daily. Continue ativan 0.5 mg twice daily as needed for  anxiety and will monitor   Vitamin B12 deficiency Will continue monthly vit b12 injection   Unspecified vitamin D deficiency continue  vit d 2000 units daily. Fall precautions  Rhinitis, allergic continue flonase 2 puffs daily for now, stable   Labs/tests ordered- b12, vitamin d

## 2013-03-13 ENCOUNTER — Non-Acute Institutional Stay (SKILLED_NURSING_FACILITY): Payer: Medicare Other | Admitting: Adult Health

## 2013-03-13 DIAGNOSIS — E538 Deficiency of other specified B group vitamins: Secondary | ICD-10-CM

## 2013-03-13 DIAGNOSIS — E559 Vitamin D deficiency, unspecified: Secondary | ICD-10-CM

## 2013-03-18 ENCOUNTER — Encounter: Payer: Self-pay | Admitting: Adult Health

## 2013-03-18 MED ORDER — CYANOCOBALAMIN 1000 MCG/ML IJ SOLN: 1000.0000 ug | INTRAMUSCULAR | Status: DC

## 2013-03-18 MED ORDER — VITAMIN D 50 MCG (2000 UT) PO CAPS: 3000.0000 [IU] | ORAL_CAPSULE | Freq: Every day | ORAL | Status: DC

## 2013-03-18 NOTE — Progress Notes (Signed)
Patient ID: Joann Mccoy, female   DOB: 23-Aug-1918, 80 y.o.   MRN: [id number]     [facility]  Allergies  Allergen Reactions  . Sulfa Antibiotics      Chief Complaint  Patient presents with  . Acute Visit    follow up labs    HPI:  Her lab results indicated a continued deficiency of her b 12 and d vitamins. She will require medication adjustments. She is unable to participate in the hpi or ros. There are no concerns being voiced by the nursing staff at this time.   Past Medical History  Diagnosis Date  . Hypertension   . Allergy   . Anxiety   . Depression   . Urinary incontinence   . Vitamin D deficiency   . Vitamin B12 deficiency   . Fall     No past surgical history on file.  VITAL SIGNS BP 126/61  Pulse 75  Ht 5\' 5"  (1.651 m)  Wt 105 lb (47.628 kg)  BMI 17.47 kg/m2   Patient's Medications  New Prescriptions   No medications on file  Previous Medications   ACETAMINOPHEN (TYLENOL) 325 MG TABLET    Take 650 mg by mouth at bedtime.   ATENOLOL (TENORMIN) 25 MG TABLET    Take 25 mg by mouth daily.   CHOLECALCIFEROL (VITAMIN D) 2000 UNITS CAPS    Take 1 capsule (2,000 Units total) by mouth daily.   CITALOPRAM (CELEXA) 20 MG TABLET    Take 10 mg by mouth daily.    FLUTICASONE (FLONASE) 50 MCG/ACT NASAL SPRAY    Place 2 sprays into the nose daily.   LORAZEPAM (ATIVAN) 0.5 MG TABLET    Take 0.5 mg by mouth 2 (two) times daily as needed for anxiety.   OMEPRAZOLE (PRILOSEC) 20 MG CAPSULE    Take 20 mg by mouth daily.   SENNOSIDES-DOCUSATE SODIUM (SENOKOT-S) 8.6-50 MG TABLET    Take 1 tablet by mouth daily.  Modified Medications   Modified Medication Previous Medication   CYANOCOBALAMIN (,VITAMIN B-12,) 1000 MCG/ML INJECTION cyanocobalamin (,VITAMIN B-12,) 1000 MCG/ML injection      Inject 1,000 mcg into the muscle every 30 (thirty) days.    Inject 1 mL (1,000 mcg total) into the muscle once.  Discontinued Medications   No medications on file    SIGNIFICANT  DIAGNOSTIC EXAMS   LABS REVIEWED:  12-03-12: wbc 4.6; hgb 12.6; hct 39.1; mcv 101.6; plt 175; glucose 103; bun 11; creat 0.7; k+4.0; na++136; liver normal albumin 3.8 03-08-13: vit b12: 186; vit d 20.11      Review of Systems  Unable to perform ROS   Physical Exam  Constitutional:  thin  Neck: Neck supple. No JVD present.  Cardiovascular: Normal rate and regular rhythm.   Respiratory: Effort normal and breath sounds normal. No respiratory distress.  GI: Soft. Bowel sounds are normal. She exhibits no distension. There is no tenderness.  Musculoskeletal: She exhibits no edema.  Is out of bed to wheelchair  Neurological: She is alert.  Skin: Skin is warm and dry.      ASSESSMENT/ PLAN:  1. Vit b 12; deficiency: will increase her vit b12 injection to weekly for 4 weeks then return to monthly injections  2. Vit d deficiency: will increase her vit d to 3000 units daily and will monitor

## 2013-04-10 ENCOUNTER — Non-Acute Institutional Stay (SKILLED_NURSING_FACILITY): Payer: Medicare Other | Admitting: Adult Health

## 2013-04-10 DIAGNOSIS — I10 Essential (primary) hypertension: Secondary | ICD-10-CM

## 2013-04-10 DIAGNOSIS — F329 Major depressive disorder, single episode, unspecified: Secondary | ICD-10-CM

## 2013-04-10 DIAGNOSIS — F341 Dysthymic disorder: Secondary | ICD-10-CM

## 2013-04-10 DIAGNOSIS — E538 Deficiency of other specified B group vitamins: Secondary | ICD-10-CM

## 2013-04-10 DIAGNOSIS — R1319 Other dysphagia: Secondary | ICD-10-CM

## 2013-04-10 DIAGNOSIS — J309 Allergic rhinitis, unspecified: Secondary | ICD-10-CM

## 2013-04-10 DIAGNOSIS — K219 Gastro-esophageal reflux disease without esophagitis: Secondary | ICD-10-CM

## 2013-04-10 DIAGNOSIS — G8929 Other chronic pain: Secondary | ICD-10-CM

## 2013-04-10 DIAGNOSIS — E559 Vitamin D deficiency, unspecified: Secondary | ICD-10-CM

## 2013-04-10 DIAGNOSIS — K5900 Constipation, unspecified: Secondary | ICD-10-CM

## 2013-04-10 DIAGNOSIS — F32A Depression, unspecified: Secondary | ICD-10-CM

## 2013-04-10 DIAGNOSIS — F419 Anxiety disorder, unspecified: Secondary | ICD-10-CM

## 2013-04-16 ENCOUNTER — Encounter: Payer: Self-pay | Admitting: Adult Health

## 2013-04-16 DIAGNOSIS — G8929 Other chronic pain: Secondary | ICD-10-CM | POA: Insufficient documentation

## 2013-04-16 NOTE — Progress Notes (Signed)
Patient ID: Joann Mccoy, female   DOB: July 17, 1918, 80 y.o.   MRN: [id number]     [facility]  Allergies  Allergen Reactions  . Sulfa Antibiotics      Chief Complaint  Patient presents with  . Medical Managment of Chronic Issues    HPI:  She is being seen for the management of her chronic illnesses. Overall her status is without significant change in the recent past. There are no concerns being voiced by the nursing staff at this time. She is unable to fully participate in the hpi or ros.   Past Medical History  Diagnosis Date  . Hypertension   . Allergy   . Anxiety   . Depression   . Urinary incontinence   . Vitamin D deficiency   . Vitamin B12 deficiency   . Fall     No past surgical history on file.  VITAL SIGNS BP 161/58  Pulse 62  Ht 5\' 5"  (1.651 m)  Wt 105 lb 4.8 oz (47.764 kg)  BMI 17.52 kg/m2   Patient's Medications  New Prescriptions   No medications on file  Previous Medications   ACETAMINOPHEN (TYLENOL) 325 MG TABLET    Take 650 mg by mouth at bedtime.   ATENOLOL (TENORMIN) 25 MG TABLET    Take 25 mg by mouth daily.   CHOLECALCIFEROL (VITAMIN D) 2000 UNITS CAPS    Take 1.5 capsules (3,000 Units total) by mouth daily.   CITALOPRAM (CELEXA) 20 MG TABLET    Take 10 mg by mouth daily.    CYANOCOBALAMIN (,VITAMIN B-12,) 1000 MCG/ML INJECTION    Inject 1 mL (1,000 mcg total) into the muscle every 7 (seven) days. Injection weekly for 4 weeks then return to monthly   FLUTICASONE (FLONASE) 50 MCG/ACT NASAL SPRAY    Place 2 sprays into the nose daily.   LORAZEPAM (ATIVAN) 0.5 MG TABLET    Take 0.5 mg by mouth 2 (two) times daily as needed for anxiety.   OMEPRAZOLE (PRILOSEC) 20 MG CAPSULE    Take 20 mg by mouth daily.   SENNOSIDES-DOCUSATE SODIUM (SENOKOT-S) 8.6-50 MG TABLET    Take 1 tablet by mouth daily.  Modified Medications   No medications on file  Discontinued Medications   No medications on file    SIGNIFICANT DIAGNOSTIC EXAMS  LABS  REVIEWED:  12-03-12: wbc 4.6; hgb 12.6; hct 39.1; mcv 101.6; plt 175; glucose 103; bun 11; creat 0.7; k+4.0; na++136; liver normal albumin 3.8 03-08-13: vit b12: 186; vit d 20.11      Review of Systems  Unable to perform ROS   Physical Exam  Constitutional:  thin  Neck: Neck supple. No JVD present.  Cardiovascular: Normal rate and regular rhythm.   Respiratory: Effort normal and breath sounds normal. No respiratory distress.  GI: Soft. Bowel sounds are normal. She exhibits no distension. There is no tenderness.  Musculoskeletal: She exhibits no edema.  Is out of bed to wheelchair  Neurological: She is alert.  Skin: Skin is warm and dry.      ASSESSMENT/ PLAN:  1. Hypertension: will continue tenormin 25 mg daily; will have nursing check blood pressure twice daily for one week and report as her blood pressures have been variable. Will monitor  2. Vit b12 deficiency: will continue vit b12 and will monitor her status   3. Gerd: will continue prilosec 20 mg daily  4. Vit d deficiency: will continue vit d 3000 units  5. Allergic rhinitis: she is stable will continue  flonase daily  6. Depression: is stable will continue celexa 10 mg daily and will continue ativan 0.5 mg twice daily as needed   7. Dysphagia: no signs of aspiration; will continue current plan of care and will continue to monitor her status   8. Constipation: will continue senna s daily   9. Chronic pain: no indications of pain present will continue tylenol 650 mg nightly         Synthia Innocent NP [facility]  Contact [phone] Monday through Friday 8am- 5pm  After hours call [phone]

## 2013-05-06 ENCOUNTER — Non-Acute Institutional Stay (SKILLED_NURSING_FACILITY): Payer: Medicare Other | Admitting: Adult Health

## 2013-05-06 DIAGNOSIS — I10 Essential (primary) hypertension: Secondary | ICD-10-CM

## 2013-05-06 DIAGNOSIS — G8929 Other chronic pain: Secondary | ICD-10-CM

## 2013-05-06 DIAGNOSIS — J309 Allergic rhinitis, unspecified: Secondary | ICD-10-CM

## 2013-05-06 DIAGNOSIS — K219 Gastro-esophageal reflux disease without esophagitis: Secondary | ICD-10-CM

## 2013-05-06 DIAGNOSIS — K5900 Constipation, unspecified: Secondary | ICD-10-CM

## 2013-05-06 DIAGNOSIS — R1319 Other dysphagia: Secondary | ICD-10-CM

## 2013-05-06 DIAGNOSIS — E538 Deficiency of other specified B group vitamins: Secondary | ICD-10-CM

## 2013-05-11 ENCOUNTER — Encounter: Payer: Self-pay | Admitting: Adult Health

## 2013-05-11 NOTE — Progress Notes (Signed)
Patient ID: Joann Mccoy, female   DOB: June 10, 1918, 80 y.o.   MRN: [id number]     [facility]  Allergies  Allergen Reactions  . Sulfa Antibiotics      Chief Complaint  Patient presents with  . Medical Managment of Chronic Issues    HPI:  She is being seen for the management of her chronic illnesses. Overall her status remains without significant change in status. There are no concerns being voiced by the nursing staff at this time. She is unable to fully participate in the hpi or ros.    Past Medical History  Diagnosis Date  . Hypertension   . Allergy   . Anxiety   . Depression   . Urinary incontinence   . Vitamin D deficiency   . Vitamin B12 deficiency   . Fall     No past surgical history on file.  VITAL SIGNS BP 142/78  Pulse 72  Ht 5\' 5"  (1.651 m)  Wt 106 lb 6.4 oz (48.263 kg)  BMI 17.71 kg/m2   Patient's Medications  New Prescriptions   No medications on file  Previous Medications   ACETAMINOPHEN (TYLENOL) 325 MG TABLET    Take 650 mg by mouth at bedtime.   ATENOLOL (TENORMIN) 25 MG TABLET    Take 25 mg by mouth daily.   CITALOPRAM (CELEXA) 20 MG TABLET    Take 10 mg by mouth daily.    FLUTICASONE (FLONASE) 50 MCG/ACT NASAL SPRAY    Place 2 sprays into the nose daily.   LORAZEPAM (ATIVAN) 0.5 MG TABLET    Take 0.5 mg by mouth 2 (two) times daily as needed for anxiety.   OMEPRAZOLE (PRILOSEC) 20 MG CAPSULE    Take 20 mg by mouth daily.   SENNOSIDES-DOCUSATE SODIUM (SENOKOT-S) 8.6-50 MG TABLET    Take 1 tablet by mouth daily.  Modified Medications   Modified Medication Previous Medication   CHOLECALCIFEROL (VITAMIN D) 2000 UNITS CAPS Cholecalciferol (VITAMIN D) 2000 UNITS CAPS      Take 2,000 Units by mouth daily.    Take 1.5 capsules (3,000 Units total) by mouth daily.   CYANOCOBALAMIN (,VITAMIN B-12,) 1000 MCG/ML INJECTION cyanocobalamin (,VITAMIN B-12,) 1000 MCG/ML injection      Inject 1,000 mcg into the muscle every 30 (thirty) days.    Inject 1 mL  (1,000 mcg total) into the muscle every 7 (seven) days. Injection weekly for 4 weeks then return to monthly  Discontinued Medications   No medications on file    SIGNIFICANT DIAGNOSTIC EXAMS   LABS REVIEWED:  12-03-12: wbc 4.6; hgb 12.6; hct 39.1; mcv 101.6; plt 175; glucose 103; bun 11; creat 0.7; k+4.0; na++136; liver normal albumin 3.8 03-08-13: vit b12: 186; vit d 20.11      Review of Systems  Unable to perform ROS   Physical Exam  Constitutional:  thin  Neck: Neck supple. No JVD present.  Cardiovascular: Normal rate and regular rhythm.   Respiratory: Effort normal and breath sounds normal. No respiratory distress.  GI: Soft. Bowel sounds are normal. She exhibits no distension. There is no tenderness.  Musculoskeletal: She exhibits no edema.  Is out of bed to wheelchair  Neurological: She is alert.  Skin: Skin is warm and dry.      ASSESSMENT/ PLAN:  1. Hypertension: has been stable will continue tenormin 25 mg daily;  Will monitor  2. Vit b12 deficiency: will continue vit b12 and will monitor her status   3. Gerd: will continue prilosec 20  mg daily  4. Vit d deficiency: will continue vit d 2000 units  5. Allergic rhinitis: she is stable will continue flonase daily  6. Depression: is stable will continue celexa 10 mg daily and will continue ativan 0.5 mg twice daily as needed   7. Dysphagia: no signs of aspiration; will continue current plan of care and will continue to monitor her status   8. Constipation: will continue senna s daily   9. Chronic pain: no indications of pain present will continue tylenol 650 mg nightly         Synthia Innocent NP [facility]  Contact [phone] Monday through Friday 8am- 5pm  After hours call [phone]

## 2013-05-31 ENCOUNTER — Non-Acute Institutional Stay (SKILLED_NURSING_FACILITY): Payer: Medicare Other | Admitting: Adult Health

## 2013-05-31 DIAGNOSIS — F341 Dysthymic disorder: Secondary | ICD-10-CM

## 2013-05-31 DIAGNOSIS — F329 Major depressive disorder, single episode, unspecified: Secondary | ICD-10-CM

## 2013-05-31 DIAGNOSIS — E538 Deficiency of other specified B group vitamins: Secondary | ICD-10-CM

## 2013-05-31 DIAGNOSIS — J309 Allergic rhinitis, unspecified: Secondary | ICD-10-CM

## 2013-05-31 DIAGNOSIS — F419 Anxiety disorder, unspecified: Secondary | ICD-10-CM

## 2013-05-31 DIAGNOSIS — K5900 Constipation, unspecified: Secondary | ICD-10-CM

## 2013-05-31 DIAGNOSIS — G8929 Other chronic pain: Secondary | ICD-10-CM

## 2013-05-31 DIAGNOSIS — F32A Depression, unspecified: Secondary | ICD-10-CM

## 2013-05-31 DIAGNOSIS — K219 Gastro-esophageal reflux disease without esophagitis: Secondary | ICD-10-CM

## 2013-05-31 DIAGNOSIS — R1319 Other dysphagia: Secondary | ICD-10-CM

## 2013-05-31 DIAGNOSIS — I10 Essential (primary) hypertension: Secondary | ICD-10-CM

## 2013-06-09 ENCOUNTER — Encounter: Payer: Self-pay | Admitting: Adult Health

## 2013-06-09 MED ORDER — LISINOPRIL 10 MG PO TABS: 10.0000 mg | ORAL_TABLET | Freq: Every day | ORAL | Status: DC

## 2013-06-09 NOTE — Progress Notes (Signed)
Patient ID: Joann Mccoy, female   DOB: August 23, 1918, 80 y.o.   MRN: [id number]     [facility]  Allergies  Allergen Reactions  . Sulfa Antibiotics      Chief Complaint  Patient presents with  . Medical Management of Chronic Issues    HPI:  She is being seen for the management of her chronic illnesses. Overall her status remains without significant change. Her blood pressure is elevated and will require treatment. There are no concerns being voiced by the nursing staff at this time. She is unable to fully participate in the hpi or ros.    Past Medical History  Diagnosis Date  . Hypertension   . Allergy   . Anxiety   . Depression   . Urinary incontinence   . Vitamin D deficiency   . Vitamin B12 deficiency   . Fall     No past surgical history on file.  VITAL SIGNS BP 163/68  Pulse 70  Ht 5\' 5"  (1.651 m)  Wt 106 lb 6 oz (48.251 kg)  BMI 17.70 kg/m2   Patient's Medications  New Prescriptions   No medications on file  Previous Medications   ACETAMINOPHEN (TYLENOL) 325 MG TABLET    Take 650 mg by mouth at bedtime.   ATENOLOL (TENORMIN) 25 MG TABLET    Take 25 mg by mouth daily.   CHOLECALCIFEROL (VITAMIN D) 2000 UNITS CAPS    Take 2,000 Units by mouth daily.   CITALOPRAM (CELEXA) 20 MG TABLET    Take 10 mg by mouth daily.    CYANOCOBALAMIN (,VITAMIN B-12,) 1000 MCG/ML INJECTION    Inject 1,000 mcg into the muscle every 30 (thirty) days.   FLUTICASONE (FLONASE) 50 MCG/ACT NASAL SPRAY    Place 2 sprays into the nose daily.   LORAZEPAM (ATIVAN) 0.5 MG TABLET    Take 0.5 mg by mouth 2 (two) times daily as needed for anxiety.   OMEPRAZOLE (PRILOSEC) 20 MG CAPSULE    Take 20 mg by mouth daily.   SENNOSIDES-DOCUSATE SODIUM (SENOKOT-S) 8.6-50 MG TABLET    Take 1 tablet by mouth daily.  Modified Medications   No medications on file  Discontinued Medications   No medications on file    SIGNIFICANT DIAGNOSTIC EXAMS   LABS REVIEWED:  12-03-12: wbc 4.6; hgb 12.6; hct  39.1; mcv 101.6; plt 175; glucose 103; bun 11; creat 0.7; k+4.0; na++136; liver normal albumin 3.8 03-08-13: vit b12: 186; vit d 20.11      Review of Systems  Unable to perform ROS   Physical Exam  Constitutional:  thin  Neck: Neck supple. No JVD present.  Cardiovascular: Normal rate and regular rhythm.   Respiratory: Effort normal and breath sounds normal. No respiratory distress.  GI: Soft. Bowel sounds are normal. She exhibits no distension. There is no tenderness.  Musculoskeletal: She exhibits no edema.  Is out of bed to wheelchair  Neurological: She is alert.  Skin: Skin is warm and dry.      ASSESSMENT/ PLAN:  1. Hypertension: is worse  will continue tenormin 25 mg daily; will add lisinopril 10 mg daily and will check bmp in one week will monitor   2. Vit b12 deficiency: will continue vit b12 and will monitor her status   3. Gerd: will continue prilosec 20 mg daily  4. Vit d deficiency: will continue vit d 2000 units  5. Allergic rhinitis: she is stable will continue flonase daily  6. Depression: is stable will continue celexa 10  mg daily and will continue ativan 0.5 mg twice daily as needed   7. Dysphagia: no signs of aspiration; will continue current plan of care and will continue to monitor her status   8. Constipation: will continue senna s daily   9. Chronic pain: no indications of pain present will continue tylenol 650 mg nightly        Synthia Innocenteborah Green NP [facility]  Contact [phone] Monday through Friday 8am- 5pm  After hours call [phone]

## 2013-06-28 ENCOUNTER — Non-Acute Institutional Stay (SKILLED_NURSING_FACILITY): Payer: Medicare Other | Admitting: Adult Health

## 2013-06-28 DIAGNOSIS — K5900 Constipation, unspecified: Secondary | ICD-10-CM

## 2013-06-28 DIAGNOSIS — I10 Essential (primary) hypertension: Secondary | ICD-10-CM

## 2013-06-28 DIAGNOSIS — G8929 Other chronic pain: Secondary | ICD-10-CM

## 2013-06-28 DIAGNOSIS — E559 Vitamin D deficiency, unspecified: Secondary | ICD-10-CM

## 2013-06-28 DIAGNOSIS — J309 Allergic rhinitis, unspecified: Secondary | ICD-10-CM

## 2013-06-28 DIAGNOSIS — K219 Gastro-esophageal reflux disease without esophagitis: Secondary | ICD-10-CM

## 2013-06-28 DIAGNOSIS — R1319 Other dysphagia: Secondary | ICD-10-CM

## 2013-06-28 DIAGNOSIS — E538 Deficiency of other specified B group vitamins: Secondary | ICD-10-CM

## 2013-06-30 ENCOUNTER — Encounter: Payer: Self-pay | Admitting: Adult Health

## 2013-06-30 MED ORDER — RANITIDINE HCL 150 MG PO TABS: 150.0000 mg | ORAL_TABLET | Freq: Every day | ORAL | Status: DC

## 2013-06-30 NOTE — Progress Notes (Signed)
Patient ID: Joann Mccoy, female   DOB: 1918-07-15, 80 y.o.   MRN: [id number]    Joann Mccoy  Allergies  Allergen Reactions  . Sulfa Antibiotics      Chief Complaint  Patient presents with  . Medical Management of Chronic Issues    HPI:  She is being seen for the management of her chronic illnesses. Overall her status remains without significant change. There are no concerns being voiced by the nursing staff at this time. She cannot participate in the hpi or ros.     Past Medical History  Diagnosis Date  . Hypertension   . Allergy   . Anxiety   . Depression   . Urinary incontinence   . Vitamin D deficiency   . Vitamin B12 deficiency   . Fall     No past surgical history on file.  VITAL SIGNS BP 144/80  Pulse 70  Ht 5\' 5"  (1.651 m)  Wt 115 lb 3.2 oz (52.254 kg)  BMI 19.17 kg/m2   Patient's Medications  New Prescriptions   No medications on file  Previous Medications   ACETAMINOPHEN (TYLENOL) 325 MG TABLET    Take 650 mg by mouth at bedtime.   ATENOLOL (TENORMIN) 25 MG TABLET    Take 25 mg by mouth daily.   CHOLECALCIFEROL (VITAMIN D) 2000 UNITS CAPS    Take 2,000 Units by mouth daily.   CITALOPRAM (CELEXA) 20 MG TABLET    Take 10 mg by mouth daily.    CYANOCOBALAMIN (,VITAMIN B-12,) 1000 MCG/ML INJECTION    Inject 1,000 mcg into the muscle every 30 (thirty) days.   FLUTICASONE (FLONASE) 50 MCG/ACT NASAL SPRAY    Mccoy 2 sprays into the nose daily.   LISINOPRIL (PRINIVIL,ZESTRIL) 10 MG TABLET    Take 1 tablet (10 mg total) by mouth daily.   OMEPRAZOLE (PRILOSEC) 20 MG CAPSULE    Take 20 mg by mouth daily.   SENNOSIDES-DOCUSATE SODIUM (SENOKOT-S) 8.6-50 MG TABLET    Take 1 tablet by mouth daily.  Modified Medications   No medications on file  Discontinued Medications   LORAZEPAM (ATIVAN) 0.5 MG TABLET    Take 0.5 mg by mouth 2 (two) times daily as needed for anxiety.    SIGNIFICANT DIAGNOSTIC EXAMS  LABS REVIEWED:  12-03-12: wbc 4.6; hgb 12.6; hct 39.1;  mcv 101.6; plt 175; glucose 103; bun 11; creat 0.7; k+4.0; na++136; liver normal albumin 3.8 03-08-13: vit b12: 186; vit d 20.11 4--20-15: wbc 4.5; hgb 11.7; ct 36.7; mcv 102.8; plt 169; glucose 81; bun 16; creat 0.7; k+4.2; na++140; liver normal albumin 3.4 06-07-13: glucose 98; bun 18; creat 0.6; k+3.9; na++ 139       Review of Systems  Unable to perform ROS   Physical Exam  Constitutional:  thin  Neck: Neck supple. No JVD present.  Cardiovascular: Normal rate and regular rhythm.   Respiratory: Effort normal and breath sounds normal. No respiratory distress.  GI: Soft. Bowel sounds are normal. She exhibits no distension. There is no tenderness.  Musculoskeletal: She exhibits no edema.  Is out of bed to wheelchair  Neurological: She is alert.  Skin: Skin is warm and dry.      ASSESSMENT/ PLAN:  1. Hypertension:will continue tenormin 25 mg daily; lisinopril 10 mg daily  will monitor   2. Vit b12 deficiency: will continue vit b12 and will monitor her status   3. Gerd: will stop the prilosec and will begin zantac 150 mg nightly and will monitor  4. Vit d deficiency: will continue vit d 2000 units  5. Allergic rhinitis: she is stable will continue flonase daily  6. Depression: is stable will continue celexa 10 mg daily and th ativan was stopped for nonuse  7. Dysphagia: no signs of aspiration; will continue current plan of care and will continue to monitor her status   8. Constipation: will continue senna s daily   9. Chronic pain: no indications of pain present will continue tylenol 650 mg nightly        [name] NP [facility]  Contact [phone] Monday through Friday 8am- 5pm  After hours call [phone]

## 2013-07-31 ENCOUNTER — Encounter: Payer: Self-pay | Admitting: Adult Health

## 2013-07-31 ENCOUNTER — Non-Acute Institutional Stay (SKILLED_NURSING_FACILITY): Payer: Medicare Other | Admitting: Adult Health

## 2013-07-31 DIAGNOSIS — E538 Deficiency of other specified B group vitamins: Secondary | ICD-10-CM

## 2013-07-31 DIAGNOSIS — E559 Vitamin D deficiency, unspecified: Secondary | ICD-10-CM

## 2013-07-31 DIAGNOSIS — I10 Essential (primary) hypertension: Secondary | ICD-10-CM

## 2013-07-31 DIAGNOSIS — K219 Gastro-esophageal reflux disease without esophagitis: Secondary | ICD-10-CM

## 2013-07-31 DIAGNOSIS — R1319 Other dysphagia: Secondary | ICD-10-CM

## 2013-07-31 DIAGNOSIS — G8929 Other chronic pain: Secondary | ICD-10-CM

## 2013-07-31 NOTE — Progress Notes (Signed)
Patient ID: Joann Mccoy, female   DOB: [birth year], 80 y.o.   MRN: [id number]     [facility]  Allergies  Allergen Reactions  . Sulfa Antibiotics      Chief Complaint  Patient presents with  . Annual Exam    HPI:  She is being seen for her annual exam. Her health maintenance is up to date. There are no concerns being voiced by the nursing staff at this time. She is unable to participate in the hpi or ros.    Past Medical History  Diagnosis Date  . Hypertension   . Allergy   . Anxiety   . Depression   . Urinary incontinence   . Vitamin D deficiency   . Vitamin B12 deficiency   . Fall     No past surgical history on file.  History   Social History  . Marital Status: Widowed    Spouse Name: N/A    Number of Children: N/A  . Years of Education: N/A   Occupational History  . Not on file.   Social History Main Topics  . Smoking status: Unknown If Ever Smoked  . Smokeless tobacco: Not on file  . Alcohol Use: Not on file  . Drug Use: Not on file  . Sexual Activity: Not on file   Other Topics Concern  . Not on file   Social History Narrative  . No narrative on file   CONSULTS nceps Foot eye    VITAL SIGNS BP 148/60  Pulse 64  Ht 5\' 5"  (1.651 m)  Wt 106 lb 9.6 oz (48.353 kg)  BMI 17.74 kg/m2   Patient's Medications  New Prescriptions   No medications on file  Previous Medications   ACETAMINOPHEN (TYLENOL) 325 MG TABLET    Take 650 mg by mouth at bedtime.   ATENOLOL (TENORMIN) 25 MG TABLET    Take 25 mg by mouth daily.   CHOLECALCIFEROL (VITAMIN D) 2000 UNITS CAPS    Take 2,000 Units by mouth daily.   CITALOPRAM (CELEXA) 20 MG TABLET    Take 10 mg by mouth daily.    CYANOCOBALAMIN (,VITAMIN B-12,) 1000 MCG/ML INJECTION    Inject 1,000 mcg into the muscle every 30 (thirty) days.   FLUTICASONE (FLONASE) 50 MCG/ACT NASAL SPRAY    Place 2 sprays into the nose daily.   LISINOPRIL (PRINIVIL,ZESTRIL) 10 MG TABLET    Take 1 tablet (10 mg total) by  mouth daily.   RANITIDINE (ZANTAC) 150 MG TABLET    Take 1 tablet (150 mg total) by mouth at bedtime.   SENNOSIDES-DOCUSATE SODIUM (SENOKOT-S) 8.6-50 MG TABLET    Take 1 tablet by mouth daily.  Modified Medications   No medications on file  Discontinued Medications   No medications on file    SIGNIFICANT DIAGNOSTIC EXAMS  LABS REVIEWED:  12-03-12: wbc 4.6; hgb 12.6; hct 39.1; mcv 101.6; plt 175; glucose 103; bun 11; creat 0.7; k+4.0; na++136; liver normal albumin 3.8 03-08-13: vit b12: 186; vit d 20.11 4--20-15: wbc 4.5; hgb 11.7; ct 36.7; mcv 102.8; plt 169; glucose 81; bun 16; creat 0.7; k+4.2; na++140; liver normal albumin 3.4 06-07-13: glucose 98; bun 18; creat 0.6; k+3.9; na++ 139  07-01-13: vit b12: 236       Review of Systems  Unable to perform ROS   Physical Exam  Constitutional:  thin  Neck: Neck supple. No JVD present.  Cardiovascular: Normal rate and regular rhythm.   Respiratory: Effort normal and breath sounds normal. No  respiratory distress.  GI: Soft. Bowel sounds are normal. She exhibits no distension. There is no tenderness.  Musculoskeletal: She exhibits no edema.  Is out of bed to wheelchair  Neurological: She is alert.  Skin: Skin is warm and dry.      ASSESSMENT/ PLAN:  1. Hypertension:will continue tenormin 25 mg daily; lisinopril 10 mg daily  will monitor   2. Vit b12 deficiency: will continue vit b12 and will monitor her status   3. Gerd: will stop the prilosec and will begin zantac 150 mg nightly and will monitor   4. Vit d deficiency: will continue vit d 2000 units  5. Allergic rhinitis: she is stable will continue flonase daily  6. Depression: is stable will continue celexa 10 mg daily and th ativan was stopped for nonuse  7. Dysphagia: no signs of aspiration; will continue current plan of care and will continue to monitor her status   8. Constipation: will continue senna s daily   9. Chronic pain: no indications of pain present will  continue tylenol 650 mg nightly        Synthia Innocenteborah Green NP [facility]  Contact [phone] Monday through Friday 8am- 5pm  After hours call [phone]

## 2013-09-03 ENCOUNTER — Non-Acute Institutional Stay (SKILLED_NURSING_FACILITY): Payer: Medicare Other | Admitting: Adult Health

## 2013-09-03 DIAGNOSIS — F419 Anxiety disorder, unspecified: Secondary | ICD-10-CM

## 2013-09-03 DIAGNOSIS — G8929 Other chronic pain: Secondary | ICD-10-CM

## 2013-09-03 DIAGNOSIS — R1319 Other dysphagia: Secondary | ICD-10-CM

## 2013-09-03 DIAGNOSIS — F341 Dysthymic disorder: Secondary | ICD-10-CM

## 2013-09-03 DIAGNOSIS — K5900 Constipation, unspecified: Secondary | ICD-10-CM

## 2013-09-03 DIAGNOSIS — J302 Other seasonal allergic rhinitis: Secondary | ICD-10-CM

## 2013-09-03 DIAGNOSIS — F329 Major depressive disorder, single episode, unspecified: Secondary | ICD-10-CM

## 2013-09-03 DIAGNOSIS — J3089 Other allergic rhinitis: Secondary | ICD-10-CM

## 2013-09-03 DIAGNOSIS — E538 Deficiency of other specified B group vitamins: Secondary | ICD-10-CM

## 2013-09-03 DIAGNOSIS — K219 Gastro-esophageal reflux disease without esophagitis: Secondary | ICD-10-CM

## 2013-09-03 DIAGNOSIS — I10 Essential (primary) hypertension: Secondary | ICD-10-CM

## 2013-09-23 ENCOUNTER — Encounter: Payer: Self-pay | Admitting: Adult Health

## 2013-09-23 NOTE — Progress Notes (Signed)
Patient ID: Joann Mccoy, female   DOB: Jul 12, 1918, 80 y.o.   MRN: [id number]    [facility]  Allergies  Allergen Reactions  . Sulfa Antibiotics      Chief Complaint  Patient presents with  . Medical Management of Chronic Issues    HPI:  She is a long term resident of this facility being seen for the management of her chronic illnesses. Overall there is little change in her status. She is unable to fully participate in the hpi or ros. There are no nursing concerns being voiced at this time.    Past Medical History  Diagnosis Date  . Hypertension   . Allergy   . Anxiety   . Depression   . Urinary incontinence   . Vitamin D deficiency   . Vitamin B12 deficiency   . Fall     No past surgical history on file.  VITAL SIGNS BP 129/69  Pulse 67  Ht 5\' 5"  (1.651 m)  Wt 104 lb 3.2 oz (47.265 kg)  BMI 17.34 kg/m2  SpO2 95%   Patient's Medications  New Prescriptions   No medications on file  Previous Medications   ACETAMINOPHEN (TYLENOL) 325 MG TABLET    Take 650 mg by mouth at bedtime.   ATENOLOL (TENORMIN) 25 MG TABLET    Take 25 mg by mouth daily.   CHOLECALCIFEROL (VITAMIN D) 2000 UNITS CAPS    Take 2,000 Units by mouth daily.   CITALOPRAM (CELEXA) 20 MG TABLET    Take 10 mg by mouth daily.    CYANOCOBALAMIN (,VITAMIN B-12,) 1000 MCG/ML INJECTION    Inject 1,000 mcg into the muscle every 30 (thirty) days.   FLUTICASONE (FLONASE) 50 MCG/ACT NASAL SPRAY    Place 2 sprays into the nose daily.   LISINOPRIL (PRINIVIL,ZESTRIL) 10 MG TABLET    Take 1 tablet (10 mg total) by mouth daily.   RANITIDINE (ZANTAC) 150 MG TABLET    Take 1 tablet (150 mg total) by mouth at bedtime.   SENNOSIDES-DOCUSATE SODIUM (SENOKOT-S) 8.6-50 MG TABLET    Take 1 tablet by mouth daily.  Modified Medications   No medications on file  Discontinued Medications   No medications on file    SIGNIFICANT DIAGNOSTIC EXAMS  LABS REVIEWED:  12-03-12: wbc 4.6; hgb 12.6; hct 39.1; mcv 101.6; plt  175; glucose 103; bun 11; creat 0.7; k+4.0; na++136; liver normal albumin 3.8 03-08-13: vit b12: 186; vit d 20.11 4--20-15: wbc 4.5; hgb 11.7; ct 36.7; mcv 102.8; plt 169; glucose 81; bun 16; creat 0.7; k+4.2; na++140; liver normal albumin 3.4 06-07-13: glucose 98; bun 18; creat 0.6; k+3.9; na++ 139  07-01-13: vit b12: 236       Review of Systems  Unable to perform ROS   Physical Exam  Constitutional:  thin  Neck: Neck supple. No JVD present.  Cardiovascular: Normal rate and regular rhythm.   Respiratory: Effort normal and breath sounds normal. No respiratory distress.  GI: Soft. Bowel sounds are normal. She exhibits no distension. There is no tenderness.  Musculoskeletal: She exhibits no edema.  Is out of bed to wheelchair  Neurological: She is alert.  Skin: Skin is warm and dry.      ASSESSMENT/ PLAN:  1. Hypertension:will continue tenormin 25 mg daily; lisinopril 10 mg daily  will monitor   2. Vit b12 deficiency: will continue vit b12 and will monitor her status   3. Gerd: will stop the prilosec and will begin zantac 150 mg nightly and  will monitor   4. Vit d deficiency: will continue vit d 2000 units  5. Allergic rhinitis: she is stable will continue flonase daily  6. Depression: is stable will continue celexa 10 mg daily and th ativan was stopped for nonuse  7. Dysphagia: no signs of aspiration; will continue current plan of care and will continue to monitor her status   8. Constipation: will continue senna s daily   9. Chronic pain: no indications of pain present will continue tylenol 650 mg nightly        Synthia Innocent NP [facility]  Contact [phone] Monday through Friday 8am- 5pm  After hours call [phone]

## 2013-10-10 ENCOUNTER — Encounter: Payer: Self-pay | Admitting: Internal Medicine

## 2013-10-10 ENCOUNTER — Non-Acute Institutional Stay (SKILLED_NURSING_FACILITY): Payer: PRIVATE HEALTH INSURANCE | Admitting: Internal Medicine

## 2013-10-10 DIAGNOSIS — F0393 Unspecified dementia, unspecified severity, with mood disturbance: Secondary | ICD-10-CM | POA: Insufficient documentation

## 2013-10-10 DIAGNOSIS — I1310 Hypertensive heart and chronic kidney disease without heart failure, with stage 1 through stage 4 chronic kidney disease, or unspecified chronic kidney disease: Secondary | ICD-10-CM

## 2013-10-10 DIAGNOSIS — F0280 Dementia in other diseases classified elsewhere without behavioral disturbance: Secondary | ICD-10-CM

## 2013-10-10 DIAGNOSIS — F0150 Vascular dementia without behavioral disturbance: Secondary | ICD-10-CM | POA: Insufficient documentation

## 2013-10-10 DIAGNOSIS — D518 Other vitamin B12 deficiency anemias: Secondary | ICD-10-CM

## 2013-10-10 DIAGNOSIS — J309 Allergic rhinitis, unspecified: Secondary | ICD-10-CM

## 2013-10-10 DIAGNOSIS — F3289 Other specified depressive episodes: Secondary | ICD-10-CM

## 2013-10-10 DIAGNOSIS — F0151 Vascular dementia with behavioral disturbance: Secondary | ICD-10-CM

## 2013-10-10 DIAGNOSIS — K219 Gastro-esophageal reflux disease without esophagitis: Secondary | ICD-10-CM

## 2013-10-10 DIAGNOSIS — F32A Depression, unspecified: Secondary | ICD-10-CM | POA: Insufficient documentation

## 2013-10-10 DIAGNOSIS — M159 Polyosteoarthritis, unspecified: Secondary | ICD-10-CM | POA: Insufficient documentation

## 2013-10-10 DIAGNOSIS — F329 Major depressive disorder, single episode, unspecified: Secondary | ICD-10-CM

## 2013-10-10 DIAGNOSIS — F0153 Vascular dementia, unspecified severity, with mood disturbance: Secondary | ICD-10-CM

## 2013-10-10 NOTE — Progress Notes (Signed)
Patient ID: Joann Mccoy, female   DOB: [birth year], 80 y.o.   MRN: [id number]    Facility: [facility]  Chief Complaint  Patient presents with  . Medical Management of Chronic Issues   Allergies  Allergen Reactions  . Sulfa Antibiotics    HPI 80 y/o female patient is a long term resident of the facility.her care has now been [facility]. She has history of vascular dementia, HTN, gerd, constipation, allergic rhinitis among others. No new concerns for her from nursing staff. She has been at her baseline.   ROS Unable to obtain from patient . On meds for constipation. Needs assistance with bathing, dressing, toileting, transfers and eating.  Past Medical History  Diagnosis Date  . Hypertension   . Allergy   . Anxiety   . Depression   . Urinary incontinence   . Vitamin D deficiency   . Vitamin B12 deficiency   . Fall    History reviewed. No pertinent past surgical history.  Current Outpatient Prescriptions on File Prior to Visit  Medication Sig Dispense Refill  . acetaminophen (TYLENOL) 325 MG tablet Take 650 mg by mouth at bedtime.      Marland Kitchen atenolol (TENORMIN) 25 MG tablet Take 25 mg by mouth daily.      . Cholecalciferol (VITAMIN D) 2000 UNITS CAPS Take 2,000 Units by mouth daily.      . citalopram (CELEXA) 20 MG tablet Take 10 mg by mouth daily.       . cyanocobalamin (,VITAMIN B-12,) 1000 MCG/ML injection Inject 1,000 mcg into the muscle every 30 (thirty) days.      . fluticasone (FLONASE) 50 MCG/ACT nasal spray Place 2 sprays into the nose daily.      Marland Kitchen lisinopril (PRINIVIL,ZESTRIL) 10 MG tablet Take 1 tablet (10 mg total) by mouth daily.  90 tablet  3  . ranitidine (ZANTAC) 150 MG tablet Take 1 tablet (150 mg total) by mouth at bedtime.  30 tablet  11  . sennosides-docusate sodium (SENOKOT-S) 8.6-50 MG tablet Take 1 tablet by mouth daily.       No current facility-administered medications on file prior to visit.    History   Social History  . Marital Status: Widowed    Spouse Name: N/A    Number of Children: N/A  . Years of Education: N/A   Occupational History  . Not on file.   Social History Main Topics  . Smoking status: Unknown If Ever Smoked  . Smokeless tobacco: Not on file  . Alcohol Use: Not on file  . Drug Use: Not on file  . Sexual Activity: Not on file   Other Topics Concern  . Not on file   Social History Narrative  . No narrative on file   Physical exam BP 150/55  Pulse 65  Temp(Src) 98 F (36.7 C)  Resp 18  Wt 104 lb 1.6 oz (47.219 kg)  SpO2 95%  General- elderly female in no acute distress. Alert but non verbal Head- atraumatic, normocephalic Eyes- no pallor, no icterus, no discharge Neck- no lymphadenopathy Nose- normal nasal mucosa Mouth- normal mucus membrane Cardiovascular- normal s1,s2, no murmurs, normal distal pulses, no edema Respiratory- bilateral clear to auscultation, no wheeze, no rhonchi, no crackles Abdomen- bowel sounds present, soft, non tender Musculoskeletal- able to move all 4 extremities, out of bed to wheelchair Neurological- alert, non verbal Psychiatry- normal mood and affect  Labs 12-03-12: wbc 4.6; hgb 12.6; hct 39.1;  mcv 101.6; plt 175; glucose 103; bun 11; creat 0.7; k+4.0; na++136; liver normal albumin 3.8 03-08-13: vit b12: 186; vit d 20.11 4--20-15: wbc 4.5; hgb 11.7; ct 36.7; mcv 102.8; plt 169; glucose 81; bun 16; creat 0.7; k+4.2; na++140; liver normal albumin 3.4 06-07-13: glucose 98; bun 18; creat 0.6; k+3.9; na++ 139   07-01-13: vit b12: 236   Assessment/plan  Depression with dementia Stable. continue celexa 10 mg daily   Vascular dementia Stable, progression anticipated. Continue her bp medications. Assistance with ADLS, fall preventions, skin care  Allergic rhinitis Continue her flonase, stable currently  gerd Continue zantac for now, symptoms controlled  OA tyelnol has been helping her, continue this, fall  precautions. Continue vit d supplement  HTN continue tenormin 25 mg daily with lisinopril 10 mg daily    Vit b12 deficiency: continue vit b12 and will monitor her status   Constipation continue senna s daily

## 2013-10-31 ENCOUNTER — Non-Acute Institutional Stay (SKILLED_NURSING_FACILITY): Payer: PRIVATE HEALTH INSURANCE | Admitting: Internal Medicine

## 2013-10-31 DIAGNOSIS — F0150 Vascular dementia without behavioral disturbance: Secondary | ICD-10-CM

## 2013-10-31 DIAGNOSIS — F0153 Vascular dementia, unspecified severity, with mood disturbance: Secondary | ICD-10-CM

## 2013-10-31 DIAGNOSIS — M159 Polyosteoarthritis, unspecified: Secondary | ICD-10-CM

## 2013-10-31 DIAGNOSIS — I10 Essential (primary) hypertension: Secondary | ICD-10-CM

## 2013-10-31 DIAGNOSIS — F0151 Vascular dementia with behavioral disturbance: Secondary | ICD-10-CM

## 2013-10-31 DIAGNOSIS — F329 Major depressive disorder, single episode, unspecified: Secondary | ICD-10-CM

## 2013-11-08 NOTE — Progress Notes (Signed)
Patient ID: Joann Mccoy, female   DOB: 1918/02/19, 80 y.o.   MRN: [id number]      Facility: [facility]    Chief Complaint   Patient presents with   .  Medical Management of Chronic Issues      Allergies   Allergen  Reactions   .  Sulfa Antibiotics      HPI 80 y/o female patient is seen today for routine visit. Her bp has been running high recently. She has dementia making HPI and ROS difficult to obtain. No new concerns for her from nursing staff. She has been at her baseline.   ROS Unable to obtain from patient .   Needs assistance with bathing, dressing, toileting, transfers and eating. No falls reported  Past Medical History  Diagnosis Date  . Hypertension   . Allergy   . Anxiety   . Depression   . Urinary incontinence   . Vitamin D deficiency   . Vitamin B12 deficiency   . Fall    Medication reviewed. See [facility]  Physical exam BP 157/72  Pulse 72  Temp(Src) 97.8 F (36.6 C)  Resp 18  SpO2 95%  General- elderly female in no acute distress. Alert but non verbal Head- atraumatic, normocephalic Eyes- no pallor, no icterus, no discharge Neck- no lymphadenopathy Nose- normal nasal mucosa Mouth- normal mucus membrane Cardiovascular- normal s1,s2, no murmurs, normal distal pulses, no edema Respiratory- bilateral clear to auscultation, no wheeze, no rhonchi, no crackles Abdomen- bowel sounds present, soft, non tender Musculoskeletal- able to move all 4 extremities, out of bed to wheelchair Neurological- alert, non verbal Psychiatry- normal mood and affect  Labs 12-03-12: wbc 4.6; hgb 12.6; hct 39.1; mcv 101.6; plt 175; glucose 103; bun 11; creat 0.7; k+4.0; na++136; liver normal albumin 3.8 03-08-13: vit b12: 186; vit d 20.11 4--20-15: wbc 4.5; hgb 11.7; ct 36.7; mcv 102.8; plt 169; glucose 81; bun 16; creat 0.7; k+4.2; na++140; liver normal albumin 3.4 06-07-13: glucose 98; bun 18; creat 0.6; k+3.9; na++ 139   07-01-13: vit  b12: 236  10-10-13 b12 916, tsh 2.68  Assessment/plan  Hypertension Elevated bp readings. Will increase her lisinopril to 20 mg daily and continue tenormin 25 mg daily. Check bp daily and reassess dosing if needed  Vascular dementia Stable, progression anticipated. Continue her bp medications. Assistance with ADLS, fall preventions, skin care. Continue celexa for mood  OA tyelnol has been helping her, continue this, fall precautions. Continue vit d supplement

## 2013-11-28 ENCOUNTER — Non-Acute Institutional Stay (SKILLED_NURSING_FACILITY): Payer: PRIVATE HEALTH INSURANCE | Admitting: Internal Medicine

## 2013-11-28 DIAGNOSIS — F0150 Vascular dementia without behavioral disturbance: Secondary | ICD-10-CM

## 2013-11-28 DIAGNOSIS — F0151 Vascular dementia with behavioral disturbance: Secondary | ICD-10-CM

## 2013-11-28 DIAGNOSIS — F329 Major depressive disorder, single episode, unspecified: Secondary | ICD-10-CM

## 2013-11-28 DIAGNOSIS — I10 Essential (primary) hypertension: Secondary | ICD-10-CM

## 2013-11-28 DIAGNOSIS — J309 Allergic rhinitis, unspecified: Secondary | ICD-10-CM

## 2013-11-28 DIAGNOSIS — F0153 Vascular dementia, unspecified severity, with mood disturbance: Secondary | ICD-10-CM

## 2013-11-28 NOTE — Progress Notes (Signed)
Patient ID: Joann Mccoy, female   DOB: 1918/07/09, 80 y.o.   MRN: [id number]    Facility[facility]  Allergies   Allergen  Reactions   .  Sulfa Antibiotics      Chief Complaint  Patient presents with  . Medical Management of Chronic Issues   HPI 80 y/o female patient is a long term resident of the facility. She is seen for routine visit.  She has history of vascular dementia, HTN, gerd, constipation, allergic rhinitis among others. No new concerns for her from nursing staff. She has been at her baseline.   ROS Unable to obtain from patient .  Needs assistance with bathing, dressing, toileting, transfers and eating. No falls reported No skin concerns  Past Medical History  Diagnosis Date  . Hypertension   . Allergy   . Anxiety   . Depression   . Urinary incontinence   . Vitamin D deficiency   . Vitamin B12 deficiency   . Fall    Current Outpatient Prescriptions on File Prior to Visit  Medication Sig Dispense Refill  . acetaminophen (TYLENOL) 325 MG tablet Take 650 mg by mouth at bedtime.      Marland Kitchen. atenolol (TENORMIN) 25 MG tablet Take 25 mg by mouth daily.      . Cholecalciferol (VITAMIN D) 2000 UNITS CAPS Take 2,000 Units by mouth daily.      . citalopram (CELEXA) 20 MG tablet Take 10 mg by mouth daily.       . cyanocobalamin (,VITAMIN B-12,) 1000 MCG/ML injection Inject 1,000 mcg into the muscle every 30 (thirty) days.      . fluticasone (FLONASE) 50 MCG/ACT nasal spray Place 2 sprays into the nose daily.      . ranitidine (ZANTAC) 150 MG tablet Take 1 tablet (150 mg total) by mouth at bedtime.  30 tablet  11  . sennosides-docusate sodium (SENOKOT-S) 8.6-50 MG tablet Take 1 tablet by mouth daily.       No current facility-administered medications on file prior to visit.    No past surgical history on file.  Physical exam BP 134/50  Pulse 62  Temp(Src) 97.7 F (36.5 C)  Resp 17  Ht 5\' 7"  (1.702 m)  Wt 103 lb (46.72 kg)   BMI 16.13 kg/m2  SpO2 96%  General- elderly female in no acute distress. Alert but non verbal Head- atraumatic, normocephalic Eyes- no pallor, no icterus, no discharge Neck- no lymphadenopathy Nose- normal nasal mucosa Mouth- normal mucus membrane Cardiovascular- normal s1,s2, no murmurs, normal distal pulses, no edema Respiratory- bilateral clear to auscultation, no wheeze, no rhonchi, no crackles Abdomen- bowel sounds present, soft, non tender Musculoskeletal- able to move all 4 extremities, out of bed to wheelchair Neurological- alert, non verbal Psychiatry- normal mood and affect  Labs 12-03-12: wbc 4.6; hgb 12.6; hct 39.1; mcv 101.6; plt 175; glucose 103; bun 11; creat 0.7; k+4.0; na++136; liver normal albumin 3.8 03-08-13: vit b12: 186; vit d 20.11 4--20-15: wbc 4.5; hgb 11.7; ct 36.7; mcv 102.8; plt 169; glucose 81; bun 16; creat 0.7; k+4.2; na++140; liver normal albumin 3.4 06-07-13: glucose 98; bun 18; creat 0.6; k+3.9; na++ 139   07-01-13: vit b12: 236  10-10-13 b12 916, tsh 2.68  Assessment/plan  Vascular dementia Stable, progression anticipated. Continue her bp medications. Assistance with ADLS, fall preventions, skin care. Continue celexa  Allergic rhinitis Continue her flonase, stable currently  HTN continue tenormin 25 mg daily with lisinopril 10 mg  daily

## 2014-01-09 ENCOUNTER — Non-Acute Institutional Stay (SKILLED_NURSING_FACILITY): Payer: PRIVATE HEALTH INSURANCE | Admitting: Internal Medicine

## 2014-01-09 DIAGNOSIS — J309 Allergic rhinitis, unspecified: Secondary | ICD-10-CM

## 2014-01-09 DIAGNOSIS — M159 Polyosteoarthritis, unspecified: Secondary | ICD-10-CM

## 2014-01-09 DIAGNOSIS — M150 Primary generalized (osteo)arthritis: Secondary | ICD-10-CM

## 2014-01-09 DIAGNOSIS — K219 Gastro-esophageal reflux disease without esophagitis: Secondary | ICD-10-CM

## 2014-01-09 NOTE — Progress Notes (Signed)
Patient ID: Joann Mccoy C Akopyan, female   DOB: Jun 15, 1918, 80 y.o.   MRN: [id number]    Fac[facility]  Chief complaint- medical management of chronic issues  Allergies- sulfa  HPI 80 y/o female patient is seen for routine visit. No new concerns for her from nursing staff. She has been at her baseline. With her dementia, difficult to obtain hpi and ROS from patient. She is under [facility] care. No falls reported.  ROS Unable to obtain from patient .    Past Medical History  Diagnosis Date  . Hypertension   . Allergy   . Anxiety   . Depression   . Urinary incontinence   . Vitamin D deficiency   . Vitamin B12 deficiency   . Fall    [facility]  Physical exam BP 145/62 mmHg  Pulse 72  Temp(Src) 98.1 F (36.7 C)  Resp 18  SpO2 97%  General- elderly female in no acute distress.  Head- atraumatic, normocephalic Eyes- no pallor, no icterus, no discharge Cardiovascular- normal s1,s2, no murmurs, normal distal pulses, no edema Respiratory- bilateral clear to auscultation, no wheeze, no rhonchi, no crackles Abdomen- bowel sounds present, soft, non tender Musculoskeletal- able to move all 4 extremities, out of bed to wheelchair Psychiatry- normal mood and affect  Labs 12-03-12: wbc 4.6; hgb 12.6; hct 39.1; mcv 101.6; plt 175; glucose 103; bun 11; creat 0.7; k+4.0; na++136; liver normal albumin 3.8 03-08-13: vit b12: 186; vit d 20.11 4--20-15: wbc 4.5; hgb 11.7; ct 36.7; mcv 102.8; plt 169; glucose 81; bun 16; creat 0.7; k+4.2; na++140; liver normal albumin 3.4 06-07-13: glucose 98; bun 18; creat 0.6; k+3.9; na++ 139   07-01-13: vit b12: 236   10-10-13 b12 916, tsh 2.68 12-03-13 wbc 4.7, hb 11, hct 33.7, plt 148, na 138, k 3.9, bun 17, cr 0.6, glu 94  Assessment/plan  gerd On zantac and symptoms stable, monitor  Allergic rhinitis Continue her flonase, stable currently  OA Continue tylenol 650 mg bid. Monitor, fall  precautions

## 2014-01-30 ENCOUNTER — Non-Acute Institutional Stay (SKILLED_NURSING_FACILITY): Payer: PRIVATE HEALTH INSURANCE | Admitting: Internal Medicine

## 2014-01-30 DIAGNOSIS — H04123 Dry eye syndrome of bilateral lacrimal glands: Secondary | ICD-10-CM

## 2014-01-30 DIAGNOSIS — M810 Age-related osteoporosis without current pathological fracture: Secondary | ICD-10-CM

## 2014-01-30 DIAGNOSIS — D519 Vitamin B12 deficiency anemia, unspecified: Secondary | ICD-10-CM

## 2014-01-30 DIAGNOSIS — E43 Unspecified severe protein-calorie malnutrition: Secondary | ICD-10-CM

## 2014-01-30 NOTE — Progress Notes (Signed)
Patient ID: Joann Mccoy, female   DOB: 1918/08/06, 80 y.o.   MRN: [id number]    [facility]ility[facility]   Cc- routine visit  Allergies- sulfa  HPI 80 y/o female patient is seen for routine visit.  She has history of vascular dementia, HTN, gerd, constipation, allergic rhinitis among others. No new concerns for her from nursing staff. She has been at her baseline.   ROS Unable to obtain from patient with her dementia   Needs assistance with bathing, dressing, toileting, transfers and eating. No falls reported No skin concerns  Past Medical History  Diagnosis Date  . Hypertension   . Allergy   . Anxiety   . Depression   . Urinary incontinence   . Vitamin D deficiency   . Vitamin B12 deficiency   . Fall    [facility]  Physical exam BP 145/60 mmHg  Pulse 61  Temp(Src) 97 F (36.1 C)  Resp 18  SpO2 96%  General- elderly female in no acute distress. Alert but non verbal Head- atraumatic, normocephalic Eyes- no pallor, no icterus, no discharge Neck- no lymphadenopathy Nose- normal nasal mucosa Mouth- normal mucus membrane Cardiovascular- normal s1,s2, no murmurs, no edema Respiratory- bilateral clear to auscultation, no wheeze, no rhonchi, no crackles Abdomen- bowel sounds present, soft, non tender Musculoskeletal- able to move all 4 extremities, out of bed to wheelchair Neurological- alert, non verbal Psychiatry- normal mood and affect  Labs 12-03-12: wbc 4.6; hgb 12.6; hct 39.1; mcv 101.6; plt 175; glucose 103; bun 11; creat 0.7; k+4.0; na++136; liver normal albumin 3.8 03-08-13: vit b12: 186; vit d 20.11 4--20-15: wbc 4.5; hgb 11.7; ct 36.7; mcv 102.8; plt 169; glucose 81; bun 16; creat 0.7; k+4.2; na++140; liver normal albumin 3.4 06-07-13: glucose 98; bun 18; creat 0.6; k+3.9; na++ 139   07-01-13: vit b12: 236   10-10-13 b12 916, tsh 2.68 12-03-13 wbc 4.7, hb 11, hct 33.7, plt 148, na 138, k 3.9, bun 17, cr 0.6, glu 94,  ca 9.1  Assessment/plan  Dry eyes On freshkote eye drops, continue this  Senile osteoporosis Fall precuations, continue vitamin d 1000 u daily  PCM Persists, her b12 deficiency could be causing malabsorption contributing to protein malnutrition. decline anticipated with dementia and failure of different treatment strategy,encourage po intake, monitor skin for breakdown  b12 deficiency anemia Continue monthly b12 injections

## 2014-02-03 DIAGNOSIS — M159 Polyosteoarthritis, unspecified: Secondary | ICD-10-CM | POA: Insufficient documentation

## 2014-02-03 DIAGNOSIS — M150 Primary generalized (osteo)arthritis: Secondary | ICD-10-CM

## 2014-02-21 ENCOUNTER — Ambulatory Visit: Payer: Self-pay

## 2014-02-21 LAB — CBC WITH DIFFERENTIAL/PLATELET
BASOS ABS: 0 10*3/uL (ref 0.0–0.1)
Basophil %: 0.3 %
Eosinophil #: 0 10*3/uL (ref 0.0–0.7)
Eosinophil %: 0.2 %
HCT: 35.7 % (ref 35.0–47.0)
HGB: 11.7 g/dL — AB (ref 12.0–16.0)
LYMPHS ABS: 1 10*3/uL (ref 1.0–3.6)
Lymphocyte %: 11.7 %
MCH: 34.4 pg — ABNORMAL HIGH (ref 26.0–34.0)
MCHC: 32.9 g/dL (ref 32.0–36.0)
MCV: 104 fL — ABNORMAL HIGH (ref 80–100)
MONO ABS: 0.7 x10 3/mm (ref 0.2–0.9)
Monocyte %: 7.8 %
NEUTROS PCT: 80 %
Neutrophil #: 6.7 10*3/uL — ABNORMAL HIGH (ref 1.4–6.5)
PLATELETS: 129 10*3/uL — AB (ref 150–440)
RBC: 3.41 10*6/uL — AB (ref 3.80–5.20)
RDW: 14 % (ref 11.5–14.5)
WBC: 8.4 10*3/uL (ref 3.6–11.0)

## 2014-02-21 LAB — CBC AND DIFFERENTIAL
HCT: 35 % — AB (ref 36–46)
Hemoglobin: 11.4 g/dL — AB (ref 12.0–16.0)
Platelets: 161 10*3/uL (ref 150–399)
WBC: 5.6 10*3/mL

## 2014-02-21 LAB — BASIC METABOLIC PANEL
BUN: 25 mg/dL — AB (ref 4–21)
Creatinine: 0.7 mg/dL (ref 0.5–1.1)
GLUCOSE: 111 mg/dL
POTASSIUM: 3.5 mmol/L (ref 3.4–5.3)
SODIUM: 146 mmol/L (ref 137–147)

## 2014-02-21 LAB — COMPREHENSIVE METABOLIC PANEL
ALBUMIN: 3.2 g/dL — AB (ref 3.4–5.0)
ALK PHOS: 68 U/L
Anion Gap: 10 (ref 7–16)
BUN: 21 mg/dL — AB (ref 7–18)
Bilirubin,Total: 0.9 mg/dL (ref 0.2–1.0)
CALCIUM: 8.7 mg/dL (ref 8.5–10.1)
Chloride: 108 mmol/L — ABNORMAL HIGH (ref 98–107)
Co2: 23 mmol/L (ref 21–32)
Creatinine: 0.69 mg/dL (ref 0.60–1.30)
EGFR (African American): >60
EGFR (Non-African Amer.): >60
GLUCOSE: 102 mg/dL — AB (ref 65–99)
Osmolality: 284 (ref 275–301)
Potassium: 4 mmol/L (ref 3.5–5.1)
SGOT(AST): 26 U/L (ref 15–37)
SGPT (ALT): 15 U/L
SODIUM: 141 mmol/L (ref 136–145)
TOTAL PROTEIN: 6.2 g/dL — AB (ref 6.4–8.2)

## 2014-03-13 ENCOUNTER — Non-Acute Institutional Stay (SKILLED_NURSING_FACILITY): Payer: Medicare Other | Admitting: Internal Medicine

## 2014-03-13 DIAGNOSIS — F0393 Unspecified dementia, unspecified severity, with mood disturbance: Secondary | ICD-10-CM

## 2014-03-13 DIAGNOSIS — F329 Major depressive disorder, single episode, unspecified: Secondary | ICD-10-CM

## 2014-03-13 DIAGNOSIS — J189 Pneumonia, unspecified organism: Secondary | ICD-10-CM

## 2014-03-13 DIAGNOSIS — L98429 Non-pressure chronic ulcer of back with unspecified severity: Secondary | ICD-10-CM

## 2014-03-13 DIAGNOSIS — L89152 Pressure ulcer of sacral region, stage 2: Secondary | ICD-10-CM

## 2014-03-13 DIAGNOSIS — F0280 Dementia in other diseases classified elsewhere without behavioral disturbance: Secondary | ICD-10-CM

## 2014-03-13 NOTE — Progress Notes (Signed)
Patient ID: Joann Mccoy, female   DOB: Jan 11, 1919, 81 y.o.   MRN: [id number]    [facility]   Cc- routine visit  Allergies- sulfa  Code: DNR  HPI 81 y/o female patient is seen for routine visit. No new concerns for her from nursing staff. She has been at her baseline. She was diagnosed with RUL pneumonia on 02/21/14 and has completed course of rocephin and flagyl for a week. Her breathing is now stable. She has severe dementia limiting hpi and ros. She is undergoing treatment for stage 2 ulcer in left sacral area. She has history of vascular dementia, HTN, gerd, constipation, allergic rhinitis among others.   ROS Unable to obtain from patient with her dementia   Needs assistance with bathing, dressing, toileting, transfers and eating. No falls reported new skin concern- see hpi  Past Medical History  Diagnosis Date  . Hypertension   . Allergy   . Anxiety   . Depression   . Urinary incontinence   . Vitamin D deficiency   . Vitamin B12 deficiency   . Fall    [facility]. [facility]  Physical exam BP 118/45 mmHg  Pulse 72  Temp(Src) 97.6 F (36.4 C)  Resp 18  SpO2 97%  General- elderly female in no acute distress. Alert but non verbal Head- atraumatic, normocephalic Eyes- no pallor, no icterus, no discharge Neck- no lymphadenopathy Nose- normal nasal mucosa Mouth- normal mucus membrane Cardiovascular- normal s1,s2, no murmurs, no edema Respiratory- bilateral decreased air entry, no wheeze, no rhonchi, no crackles Abdomen- bowel sounds present, soft, non tender Musculoskeletal- able to move all 4 extremities, out of bed to wheelchair, generalized weakness Skin- stage 2 pressure ulcer in left sacral area Neurological- alert, non verbal Psychiatry- poor insight, minimal interaction, excessive sleepiness  Labs 12-03-12: wbc 4.6; hgb 12.6; hct 39.1; mcv 101.6; plt 175; glucose 103; bun 11; creat 0.7; k+4.0; na++136; liver  normal albumin 3.8 03-08-13: vit b12: 186; vit d 20.11 4--20-15: wbc 4.5; hgb 11.7; ct 36.7; mcv 102.8; plt 169; glucose 81; bun 16; creat 0.7; k+4.2; na++140; liver normal albumin 3.4 06-07-13: glucose 98; bun 18; creat 0.6; k+3.9; na++ 139   07-01-13: vit b12: 236   10-10-13 b12 916, tsh 2.68 12-03-13 wbc 4.7, hb 11, hct 33.7, plt 148, na 138, k 3.9, bun 17, cr 0.6, glu 94, ca 9.1 02-24-14 wbc 5.6, hb 11.4, hct 35, plt 161, na 146, k 3.5, bun 25, cr 0.7  Assessment/plan  CAP Completed course of ceftriaxone and flagyl, clinical improvement, high aspiration risk, monitor clinically  Stage 2 sacral ulcer Continue pressure ulcer prophylaxis, wound care, to take vitamin c and zinc supplement  Depression Has tolerated reduction in dosing of celexa well in past. Currently on 5 mg daily. With her excessive sleepiness, will d/c celexa for now and reassess

## 2014-04-08 ENCOUNTER — Non-Acute Institutional Stay (SKILLED_NURSING_FACILITY): Payer: Medicare Other | Admitting: Registered Nurse

## 2014-04-08 ENCOUNTER — Encounter: Payer: Self-pay | Admitting: Registered Nurse

## 2014-04-08 DIAGNOSIS — I10 Essential (primary) hypertension: Secondary | ICD-10-CM | POA: Diagnosis not present

## 2014-04-08 DIAGNOSIS — R627 Adult failure to thrive: Secondary | ICD-10-CM | POA: Diagnosis not present

## 2014-04-08 DIAGNOSIS — L89152 Pressure ulcer of sacral region, stage 2: Secondary | ICD-10-CM

## 2014-04-08 DIAGNOSIS — F0390 Unspecified dementia without behavioral disturbance: Secondary | ICD-10-CM | POA: Diagnosis not present

## 2014-04-08 DIAGNOSIS — K219 Gastro-esophageal reflux disease without esophagitis: Secondary | ICD-10-CM | POA: Diagnosis not present

## 2014-04-08 DIAGNOSIS — J309 Allergic rhinitis, unspecified: Secondary | ICD-10-CM

## 2014-04-08 DIAGNOSIS — M159 Polyosteoarthritis, unspecified: Secondary | ICD-10-CM

## 2014-04-08 DIAGNOSIS — L98429 Non-pressure chronic ulcer of back with unspecified severity: Secondary | ICD-10-CM

## 2014-04-08 DIAGNOSIS — E538 Deficiency of other specified B group vitamins: Secondary | ICD-10-CM | POA: Diagnosis not present

## 2014-04-08 NOTE — Progress Notes (Signed)
Patient ID: Joann Mccoy, female   DOB: [birth year], 81 y.o.   MRN: [id number]   Place of Service: [facility]  Allergies  Allergen Reactions  . Sulfa Antibiotics     Code Status: DNR  Goals of Care: Comfort and Quality of Life/Hospice  Chief Complaint  Patient presents with  . Medical Management of Chronic Issues    FTT, dementia, stage 2 sacral ucler, HTN, GERD, vit B12 def    HPI 81 y.o. female with PMH of dementia, HTN, GERD, vitamin B12 deficiency, depression with anxiety, OA among others is being seen for a routine visit. She was recently admitted to Hospice service from [place] [facility]. Weight declines with 11 lbs weight loss since 11/2013. No recent fall reported. Stage 2 sacral ulcer has recently resolved. No significant change in behaviors or functional status reported. No concerns from staff. BP range 120-130s/50-60s. GERD stable on H2 blocker. Vitamin B12 def stable with monthly vit B12 injection. Seen in room today. Unable to participate in HPI and ROS but denies any pain or discomfort.   Review of Systems Unable to  Obtain due to dementia.   Past Medical History  Diagnosis Date  . Hypertension   . Allergy   . Anxiety   . Depression   . Urinary incontinence   . Vitamin D deficiency   . Vitamin B12 deficiency   . Fall     No past surgical history on file.  History   Social History  . Marital Status: Married    Spouse Name: N/A  . Number of Children: N/A  . Years of Education: N/A   Occupational History  . Not on file.   Social History Main Topics  . Smoking status: Unknown If Ever Smoked  . Smokeless tobacco: Not on file  . Alcohol Use: Not on file  . Drug Use: Not on file  . Sexual Activity: Not on file   Other Topics Concern  . Not on file   Social History Narrative    No family history on file.    Medication List       This list is accurate as of: 04/08/14  6:43 PM.  Always use your most recent med list.               acetaminophen 325 MG tablet  Commonly known as:  TYLENOL  Take 650 mg by mouth 2 (two) times daily.     atenolol 25 MG tablet  Commonly known as:  TENORMIN  Take 25 mg by mouth daily.     cyanocobalamin 1000 MCG/ML injection  Commonly known as:  (VITAMIN B-12)  Inject 1,000 mcg into the muscle every 30 (thirty) days.     fluticasone 50 MCG/ACT nasal spray  Commonly known as:  FLONASE  Place 2 sprays into the nose daily.     lisinopril 10 MG tablet  Commonly known as:  PRINIVIL,ZESTRIL  Take 20 mg by mouth daily.     ranitidine 150 MG tablet  Commonly known as:  ZANTAC  Take 1 tablet (150 mg total) by mouth at bedtime.     sennosides-docusate sodium 8.6-50 MG tablet  Commonly known as:  SENOKOT-S  Take 1 tablet by mouth daily.     Vitamin D 2000 UNITS Caps  Take 2,000 Units by mouth daily.        Physical Exam  BP 144/65 mmHg  Pulse 75  Temp(Src) 98.4 F (36.9 C)  Resp 18  Ht  (1.651  m)  Wt 92 lb (41.731 kg)  BMI 15.31 kg/m2  SpO2 95%  Constitutional: very frail elderly female in no acute distress.  HEENT: Normocephalic and atraumatic. PERRL. No scleral icterus. Oral mucosa moist. Posterior pharynx clear of any exudate or lesions.  Neck: Supple and nontender. No lymphadenopathy, masses, or thyromegaly. No JVD or carotid bruits. Cardiac: Normal S1, S2. RRR without appreciable murmurs, rubs, or gallops. Distal pulses intact. Trace dependent edema.  Lungs: No respiratory distress. Breath sounds clear bilaterally without rales, rhonchi, or wheezes. Abdomen: Audible bowel sounds in all quadrants. Soft, nontender, nondistended. Musculoskeletal: generalized weakness. Limited ROM and strength throughout.   Skin: Warm and dry. No rash noted. No erythema.  Neurological: Alert  Psychiatric: Appropriate mood and affect.   Labs Reviewed  CBC Latest Ref Rng 02/21/2014  WBC - 5.6  Hemoglobin 12.0 - 16.0 g/dL 11.4(A)  Hematocrit 36 - 46 % 35(A)  Platelets 150 - 399  K/L 161    CMP Latest Ref Rng 02/21/2014  BUN 4 - 21 mg/dL 16(X25(A)  Creatinine 0.5 - 1.1 mg/dL 0.7  Sodium 096137 - 045147 mmol/L 146  Potassium 3.4 - 5.3 mmol/L 3.5   Assessment & Plan 1. Vitamin B12 deficiency Stable. Continue vit B12 1000mcg via injection monthly and monitor  2. Gastroesophageal reflux disease without esophagitis No issues. Continue zantac 150mg  daily at bedtime.   3. Allergic rhinitis, unspecified allergic rhinitis type Continue flonase nasal spray-2 sprays in each nostril daily.   4. Generalized osteoarthrosis, involving multiple sites Stable. Continue tylenol 650mg  twice daily and monitor.   5. Ulcer of sacral region, stage 2 Resolved. Continue pressure ulcer precautions.   6. Essential hypertension, benign Stable. Continue lisinopril 20mg  daily with atenolol 25mg  daily. Continue to monitor  7. Dementia, without behavioral disturbance Advanced. Further decline anticipated. Continue to provide assistance with ADL and monitor for change in behaviors  8. FTT (failure to thrive) in adult Goal of care is comfort. Will not make any change at this time. Continue puree diet and current dietary recommendations. Continue to monitor.    Family/Staff Communication Plan of care discussed with nursing staff. Nursing staff verbalized understanding and agree with plan of care. No additional questions or concerns reported.    Loura BackKim Kalaysia Demonbreun, MSN, AGNP-C [facility] [address] [place], [place] [address] [phone] [8am-5pm] After hours: [phone]

## 2014-05-07 ENCOUNTER — Non-Acute Institutional Stay (SKILLED_NURSING_FACILITY): Payer: Medicare Other | Admitting: Registered Nurse

## 2014-05-07 DIAGNOSIS — I10 Essential (primary) hypertension: Secondary | ICD-10-CM | POA: Diagnosis not present

## 2014-05-07 DIAGNOSIS — K219 Gastro-esophageal reflux disease without esophagitis: Secondary | ICD-10-CM

## 2014-05-07 DIAGNOSIS — J309 Allergic rhinitis, unspecified: Secondary | ICD-10-CM

## 2014-05-07 DIAGNOSIS — R627 Adult failure to thrive: Secondary | ICD-10-CM

## 2014-05-07 DIAGNOSIS — M159 Polyosteoarthritis, unspecified: Secondary | ICD-10-CM

## 2014-05-07 DIAGNOSIS — F0390 Unspecified dementia without behavioral disturbance: Secondary | ICD-10-CM | POA: Diagnosis not present

## 2014-05-07 DIAGNOSIS — E538 Deficiency of other specified B group vitamins: Secondary | ICD-10-CM | POA: Diagnosis not present

## 2014-05-08 ENCOUNTER — Encounter: Payer: Self-pay | Admitting: Registered Nurse

## 2014-05-08 NOTE — Progress Notes (Signed)
Patient ID: Joann Mccoy, female   DOB: [birth year], 81 y.o.   MRN: [id number]   Place of Service: [facility]  Allergies  Allergen Reactions  . Sulfa Antibiotics     Code Status: DNR  Goals of Care: Comfort and Quality of Life/Hospice  Chief Complaint  Patient presents with  . Medical Management of Chronic Issues    FTT, dementia, HTN, OA, AR, GERD, vit B12 def    HPI 81 y.o. female Hospice patient with PMH of dementia, HTN, GERD, vitamin B12 deficiency, depression with anxiety, OA among others is being seen for a routine visit. Weight stable over the past 30 days. No recent fall or skin concerns reported.. No significant change in behaviors or functional status reported. No concerns from staff.  GERD stable on H2 blocker. Vitamin B12 def stable with monthly vit B12 injection. Dementia is advanced.  Pain r/t to OA is adequately controlled with tylenol. HTN controlled with BP range 120s-130s/80s. Seen in room today. Unable to participate in HPI and ROS.   Review of Systems Unable to obtain due to dementia.   Past Medical History  Diagnosis Date  . Hypertension   . Allergy   . Anxiety   . Depression   . Urinary incontinence   . Vitamin D deficiency   . Vitamin B12 deficiency   . Fall     No past surgical history on file.  History   Social History  . Marital Status: Married    Spouse Name: N/A  . Number of Children: N/A  . Years of Education: N/A   Occupational History  . Not on file.   Social History Main Topics  . Smoking status: Unknown If Ever Smoked  . Smokeless tobacco: Not on file  . Alcohol Use: Not on file  . Drug Use: Not on file  . Sexual Activity: Not on file   Other Topics Concern  . Not on file   Social History Narrative       Medication List       This list is accurate as of: 05/07/14 11:59 PM.  Always use your most recent med list.               acetaminophen 325 MG tablet  Commonly known as:  TYLENOL  Take 650 mg by  mouth 2 (two) times daily.     atenolol 25 MG tablet  Commonly known as:  TENORMIN  Take 25 mg by mouth daily.     cyanocobalamin 1000 MCG/ML injection  Commonly known as:  (VITAMIN B-12)  Inject 1,000 mcg into the muscle every 30 (thirty) days.     fluticasone 50 MCG/ACT nasal spray  Commonly known as:  FLONASE  Place 2 sprays into the nose daily.     lisinopril 10 MG tablet  Commonly known as:  PRINIVIL,ZESTRIL  Take 20 mg by mouth daily.     ranitidine 150 MG tablet  Commonly known as:  ZANTAC  Take 1 tablet (150 mg total) by mouth at bedtime.     sennosides-docusate sodium 8.6-50 MG tablet  Commonly known as:  SENOKOT-S  Take 1 tablet by mouth daily.     Vitamin D 2000 UNITS Caps  Take 2,000 Units by mouth daily.        Physical Exam  BP 120/70 mmHg  Pulse 67  Temp(Src) 97.3 F (36.3 C)  Resp 20  Ht 5\' 5"  (1.651 m)  Wt 92 lb (41.731 kg)  BMI 15.31 kg/m2  Constitutional: very frail elderly female in no acute distress.  HEENT: Normocephalic and atraumatic. PERRL. No scleral icterus. Unable to assess oral cavity Neck: No lymphadenopathy, masses, or thyromegaly. No JVD or carotid bruits. Cardiac: Normal S1, S2. RRR without appreciable murmurs, rubs, or gallops. Distal pulses intact. Trace dependent edema.  Lungs: No respiratory distress. Breath sounds clear bilaterally without rales, rhonchi, or wheezes. Abdomen: Audible bowel sounds in all quadrants. Soft, nontender, nondistended. Musculoskeletal: Wheelchair bound.  Skin: Warm and dry. No rash noted.   Neurological: Arousable  Psychiatric: flat affect. Demented at baseline.   Labs Reviewed  CBC Latest Ref Rng 02/21/2014  WBC - 5.6  Hemoglobin 12.0 - 16.0 g/dL 11.4(A)  Hematocrit 36 - 46 % 35(A)  Platelets 150 - 399 K/L 161    CMP Latest Ref Rng 02/21/2014  BUN 4 - 21 mg/dL 16(X)  Creatinine 0.5 - 1.1 mg/dL 0.7  Sodium 096 - 045 mmol/L 146  Potassium 3.4 - 5.3 mmol/L 3.5   Assessment & Plan 1.  Vitamin B12 deficiency No issues. Continue vit B12 via injection monthly   2. Gastroesophageal reflux disease without esophagitis Stable. Continue zantac  daily at bedtime.   3. Allergic rhinitis, unspecified allergic rhinitis type Stable. Continue flonase nasal spray-2 sprays in each nostril daily.   4. Generalized osteoarthrosis, involving multiple sites No-issues. Continue tylenol  twice daily and monitor.   5. Essential hypertension, benign Controlled. Continue lisinopril  daily with atenolol  daily.  6. Dementia, without behavioral disturbance Advanced. Further decline anticipated. Continue assist with ADLs and monitor for change in behavior  7. FTT (failure to thrive) in adult Weight stable over the past 30 days. Goal of care is comfort. Continue puree diet and medpass twice daily.    Family/Staff Communication Plan of care discussed nursing staff. Nursing staff verbalized understanding and agree with plan of care. No additional questions or concerns reported.    Loura Back, MSN, AGNP-C [facility] [address] [place], [place] [address] [phone] [8am-5pm] After hours: [phone]

## 2014-06-06 ENCOUNTER — Non-Acute Institutional Stay (SKILLED_NURSING_FACILITY): Payer: Medicare Other | Admitting: Registered Nurse

## 2014-06-06 ENCOUNTER — Encounter: Payer: Self-pay | Admitting: Registered Nurse

## 2014-06-06 DIAGNOSIS — F0390 Unspecified dementia without behavioral disturbance: Secondary | ICD-10-CM

## 2014-06-06 DIAGNOSIS — E538 Deficiency of other specified B group vitamins: Secondary | ICD-10-CM

## 2014-06-06 DIAGNOSIS — M159 Polyosteoarthritis, unspecified: Secondary | ICD-10-CM

## 2014-06-06 DIAGNOSIS — K219 Gastro-esophageal reflux disease without esophagitis: Secondary | ICD-10-CM | POA: Diagnosis not present

## 2014-06-06 DIAGNOSIS — R627 Adult failure to thrive: Secondary | ICD-10-CM | POA: Diagnosis not present

## 2014-06-06 DIAGNOSIS — J309 Allergic rhinitis, unspecified: Secondary | ICD-10-CM

## 2014-06-06 DIAGNOSIS — I10 Essential (primary) hypertension: Secondary | ICD-10-CM | POA: Diagnosis not present

## 2014-06-06 NOTE — Progress Notes (Signed)
Patient ID: Joann Mccoy, female   DOB: 01/19/1919, 81 y.o.   MRN: [id number]   Place of Service: [facility]  Allergies  Allergen Reactions  . Sulfa Antibiotics     Code Status: DNR  Goals of Care: Comfort and Quality of Life/Hospice  Chief Complaint  Patient presents with  . Medical Management of Chronic Issues    FTT, dementia, HTN, AR, OA, vit B12 def, GERD    HPI 81 y.o. female Hospice patient with PMH of dementia, HTN, GERD, vitamin B12 deficiency, depression with anxiety, OA among others is being seen for a routine visit. Weight stable around low 90s over the past 30 days. No recent fall or skin concerns reported. No significant change in behaviors or functional status reported. No concerns from staff.  Vitamin B12 def stable with monthly vit B12 injection. Dementia is advanced.  GERD stable on H2 blocker. OA is adequately controlled with tylenol. HTN controlled with BP range 120s-150s/50-80s. Seen in room today. Unable to participate in HPI and ROS.   Review of Systems Unable to obtain due to dementia.   Past Medical History  Diagnosis Date  . Hypertension   . Allergy   . Anxiety   . Depression   . Urinary incontinence   . Vitamin D deficiency   . Vitamin B12 deficiency   . Fall     No past surgical history on file.  History   Social History  . Marital Status: Married    Spouse Name: N/A  . Number of Children: N/A  . Years of Education: N/A   Occupational History  . Not on file.   Social History Main Topics  . Smoking status: Unknown If Ever Smoked  . Smokeless tobacco: Not on file  . Alcohol Use: Not on file  . Drug Use: Not on file  . Sexual Activity: Not on file   Other Topics Concern  . Not on file   Social History Narrative       Medication List       This list is accurate as of: 06/06/14  1:28 PM.  Always use your most recent med list.               acetaminophen 325 MG tablet  Commonly known as:  TYLENOL  Take 650 mg  by mouth 2 (two) times daily.     atenolol 25 MG tablet  Commonly known as:  TENORMIN  Take 25 mg by mouth daily.     cyanocobalamin 1000 MCG/ML injection  Commonly known as:  (VITAMIN B-12)  Inject 1,000 mcg into the muscle every 30 (thirty) days.     fluticasone 50 MCG/ACT nasal spray  Commonly known as:  FLONASE  Place 2 sprays into the nose daily.     lisinopril 10 MG tablet  Commonly known as:  PRINIVIL,ZESTRIL  Take 20 mg by mouth daily.     ranitidine 150 MG tablet  Commonly known as:  ZANTAC  Take 1 tablet (150 mg total) by mouth at bedtime.     sennosides-docusate sodium 8.6-50 MG tablet  Commonly known as:  SENOKOT-S  Take 1 tablet by mouth daily.     Vitamin D 2000 UNITS Caps  Take 2,000 Units by mouth daily.        Physical Exam  BP 132/60 mmHg  Pulse 70  Temp(Src) 97 F (36.1 C)  Resp 16  Ht  (1.651 m)  Wt 92 lb (41.731 kg)  BMI 15.31  kg/m2  SpO2 96%  Constitutional: very frail elderly female in no acute distress.  HEENT: Normocephalic and atraumatic. PERRL. No scleral icterus. Unable to assess oral cavity Neck: No lymphadenopathy, masses, or thyromegaly. No JVD or carotid bruits. Cardiac: Normal S1, S2. RRR without appreciable murmurs, rubs, or gallops. Distal pulses intact. No dependent edema.  Lungs: No respiratory distress. Breath sounds clear bilaterally without rales, rhonchi, or wheezes. Abdomen: Audible bowel sounds in all quadrants. Soft, nontender, nondistended. Musculoskeletal: Wheelchair bound.  Skin: Warm and dry. No rash noted.   Neurological: Arousable  Psychiatric: flat affect. Demented at baseline.   Labs Reviewed  CBC Latest Ref Rng 02/21/2014  WBC - 5.6  Hemoglobin 12.0 - 16.0 g/dL 11.4(A)  Hematocrit 36 - 46 % 35(A)  Platelets 150 - 399 K/L 161    CMP Latest Ref Rng 02/21/2014  BUN 4 - 21 mg/dL 16(X25(A)  Creatinine 0.5 - 1.1 mg/dL 0.7  Sodium 096137 - 045147 mmol/L 146  Potassium 3.4 - 5.3 mmol/L 3.5   Assessment &  Plan 1. Vitamin B12 deficiency No issues. Continue vit B12 1000mcg via injection monthly   2. Gastroesophageal reflux disease without esophagitis No issues. Change zantac to prn.  Monitor for symptoms  3. Allergic rhinitis, unspecified allergic rhinitis type No issues. Discontinue flonase   4. Generalized osteoarthrosis, involving multiple sites Stable. Continue tylenol 650mg  twice daily and monitor.   5. Essential hypertension, benign Controlled. Continue lisinopril 20mg  daily with atenolol 25mg  daily.  6. Dementia, without behavioral disturbance Advanced. Further decline anticipated. Continue assist with ADLs and monitor for change in behavior. Fall and pressure ulcer precautions.   7. FTT (failure to thrive) in adult Weight stable. Goal of care is comfort. Continue puree diet and medpass 120ml twice daily and monitor.   Family/Staff Communication Plan of care discussed nursing staff. Nursing staff verbalized understanding and agree with plan of care. No additional questions or concerns reported.    Loura BackKim Deval Mroczka, MSN, AGNP-C [facility] [address] [place], [place] [phone] [8am-5pm] After hours: [phone]

## 2014-06-08 NOTE — H&P (Signed)
PATIENT NAME:  Joann Mccoy, Joann Mccoy MR#:  [id number] DATE OF BIRTH:  [birth year]  DATE OF ADMISSION:  08/16/2011  PRIMARY CARE PHYSICIAN: Alonna BucklerAndrew Lamb, MD   ER REFERRING PHYSICIAN: Dana AllanMarwan Powers, MD      Onalee HuaCHIEF COMPLAINT: Altered mental status.  The patient also had parotid swelling.  HISTORY OF PRESENT ILLNESS: The patient is a 81 year old female who came from [facility]. The patient is a poor historian due to dementia, but she was sent here because of fever and left parotid swelling. The patient also was somewhat restless and not eating well, so the Staff noticed this and sent her here. The patient is at [facility] since April 15th. Before that, [facility]  from February 9th to April 15th, and she was discharged to [facility]. The patient is not able to give me any history, and history is obtained from the ER charts and also the patient records.   PAST MEDICAL HISTORY: Significant for: 1. Hypertension. 2. Hyperlipidemia. 3. Gastroesophageal reflux disease. 4. Osteoporosis. 5. Diverticulosis. 6. Aortic insufficiency. 7. Hemorrhoids.   ALLERGIES: Sulfa.    PAST SURGICAL HISTORY: Significant for: 1. Cataract surgery.  2. Deviated nasal septum. 3. Breast biopsy. 4. Hernia surgery. 5. Hysterectomy. 6. Tonsillectomy. 7. Hemorrhoidectomy.  8. Colonoscopy. 9. Polypectomy. 10. Right hip fracture with repair in 2004.   FAMILY HISTORY: According to the nursing home records, the patient's father died of cerebrovascular accident. Brother had colon cancer, and mother had colon cancer.   SOCIAL HISTORY: No tobacco abuse. No alcohol abuse.   MEDICATIONS: Phineas Semen([name]) 1. Oxybutynin 5 mg daily. 2. Atenolol 25 mg p.o. daily.  3. Senna 8.6/50 mg daily.  4. Florastor 250 mg p.o. b.i.d.  5. Avelox, actually started on today first, 400 mg daily was started for upper respiratory infection. 6. Fluticasone 50 mcg nasal spray. 7. Omeprazole 20 mg p.o. daily.  8. Tylenol as needed.    NOTE: All these medications were given this morning at  9:00 a.m.   DIET: The patient takes a soft diet, and according to the records the patient is not ambulatory but able to feed herself.   CODE STATUS: The patient was followed by [facility]. Code status was  DO NOT RESUSCITATE.   REVIEW OF SYSTEMS: Unobtainable due to dementia.   PHYSICAL EXAMINATION:  VITAL SIGNS: Temperature 99, pulse 68, respirations 16, blood pressure 141/62, saturation 94% on room air.   GENERAL: The patient opens eyes but cannot talk. Complains of pain when I touch on the left side of parotid.   SKIN: The patient's skin on the left side parotid is slightly swollen and hard.   HEENT: Head is atraumatic, normocephalic. The patient has no tympanic membrane congestion. No turbinate hypertrophy. On my examination of mouth and throat, the patient's parotid is swollen on the left side, was tender to palpation. When I palpated she was withdrawing and groaning.   CARDIOVASCULAR: S1 and S2 regular. No murmurs.   LUNGS: Clear to auscultation. No wheeze. No rales.   ABDOMEN: Soft, nontender, nondistended. Bowel sounds are present. No organomegaly. Pulses are present bilaterally in pedal and dorsalis pedis artery.   MUSCULOSKELETAL: At this time, I do not see any obvious deficit. I am unable to test her because she is not following what I am saying due to dementia.   NEUROLOGIC: The patient has no focal neurological deficit.   PSYCHIATRIC: The patient is demented, not oriented to time and place.   LABORATORY, DIAGNOSTIC AND RADIOLOGICAL  DATA:  CT of the neck showed a symmetric enlargement of parotid and surrounding musculature of the left compared to the right without definite evident mass. Further evaluation with ENT consult was recommended. A diffusely infiltrating mass involving the described structures cannot be excluded. Infection is a diagnostic consideration. The patient has no drainable fluid  collection. CT head is showing a small punctate focus of intraparenchymal hemorrhage along the posterior aspect of apex of right lateral ventricle and small vessel ischemic changes and involutional changes.  Urine is yellow-colored cloudy urine, trace leukocyte esterase, bacteria none.  WBC 16.5, hemoglobin 12.8, hematocrit 38.1, platelets 159.  Electrolytes: Sodium 132, potassium 4.2, chloride 98, bicarbonate 26, BUN 23, creatinine 0.93, glucose 111.  Troponin 0.21. The patient's CBC done in April at [facility], her hemoglobin was 10.7 and WBC was 5.8.  EKG showed normal sinus with 64 beats per minute, and T wave inversion in leads V1, V2.    ASSESSMENT AND PLAN: The patient is a 81 year old female with dementia, came in from [facility] with: 1. Fever, left parotid swelling, and altered mental status: The patient was noted to have left parotitis with leukocytosis. The patient is very tender to palpation. CT neck does not show abscess at this time, but the parotid is very swollen. She will be going to get IV antibiotics, IV fluids, and ENT consult.  2. The patient has other medical problems of gastroesophageal reflux disease, hypertension, hyperlipidemia: Continue other medications.  3. CODE STATUS: DO NOT RESUSCITATE. She was followed with Hospice Home before, so we are going to consult Palliative Care. The patient also has a small intraparenchymal hemorrhage on the CAT scan, so we are going to request a Palliative Care consult possibly  to see if she can be a candidate for Hospice Home this time. Neurology, Dr. Sherryll Burger, has been already informed by the Emergency Room physician regarding intraparenchymal small hemorrhage. He suggested no further intervention because her blood pressure is stable.   TIME SPENT:   Total time spent on History and Physical about 60 minutes.  CODE STATUS: DO NOT RESUSCITATE   ____________________________ Katha Hamming, MD sk:cbb D: 08/16/2011 18:18:21  ET T: 08/17/2011 06:30:51 ET JOB#: [id number]  cc: Katha Hamming, MD, <Dictator> Reola Mosher. Randa Lynn, MD Katha Hamming MD ELECTRONICALLY SIGNED 08/23/2011 9:58

## 2014-06-08 NOTE — Discharge Summary (Signed)
PATIENT NAME:  Joann Mccoy, Joann Mccoy MR#:  [id number] DATE OF BIRTH:  01-Nov-1918  DATE OF ADMISSION:  08/16/2011 DATE OF DISCHARGE:  08/19/2011  DISCHARGE DIAGNOSES:  1. Systemic antiinflammatory response syndrome secondary to parotitis.  2. Altered mental status with incidental finding of intraparenchymal hemorrhage.  3. Hypertension. 4. Hyperlipidemia. 5. Gastroesophageal reflux disease.  6. Osteoporosis. 7. Hemorrhoids.   DISCHARGE MEDICATIONS:  1. Oxybutynin 5 mg p.o. daily.  2. Atenolol 25 mg daily. 3. Senna 8.6/50 mg p.o. daily.  4. Florastor 250 mg p.o. twice a day. 5. Fluticasone 50 mcg nasal spray. 6. Omeprazole 20 mg p.o. daily.  7. Augmentin ES suspension 600 mg/42.9 mL, 5 mL twice daily for 14 days - New Medication.   CODE STATUS: DO NOT RESUSCITATE.   CONSULTANTS:  Joann Salmonshapman McQueen, MD - ENT.  HOSPITAL COURSE:  1. This is a 81 year old female patient brought from [facility] because of altered mental status. The patient was found to have an incidental intercerebral hemorrhage which was mild. The patient was seen by the ER physician. CT of the head showed punctate focus of intraparenchymal hemorrhage in the posterior aspect of the right lateral ventricle. The patient did not have any further neurological deficit. The ER physician spoke to [name] and recommended no further intervention because of the age. The patient is not on aspirin or Plavix and neuros were followed and did not have any further deterioration of mental status in fact she was able to communicate the next day.  2. Parotitis. The patient has pain and fever with pain and tenderness on the left parotid region. CT of the neck showed asymmetric enlargement of the parotid and surrounding musculature on the left side compared to the right side and no definite abscess. The patient had a Watchman count up to 16.5 so she was admitted for parotitis and altered mental status and started on IV vancomycin and Zosyn. Her blood  cultures have been negative. She has been afebrile since admission and Joann Mccoy count did drop to 13.1 and 10.2. The patient tolerated IV antibiotics. She was seen by Joann Mccoy who recommended to discharge her with Augmentin suspension 600 mg p.o. twice a day, so I wrote a prescription for Augmentin for two weeks. The patient is 90 percent better and able to eat her lunch and breakfast. A little bit of pain is there but much better. She also was seen by palliative care, Joann Mccoy, because she was at [facility] with hospice.  3. History of hypertension. Blood pressure has been initially low but has been picking up and did have some elevated blood pressure yesterday, around 168/54 with heart rate of 62. She is on atenolol 25 mg daily. So I will add Norvasc 5 mg daily to her blood pressure medications. Diet is pureed diet.  4. For her gastroesophageal reflux disease, she is on a PPI, Omeprazole, continue that.  5. For constipation she is on Senokot as needed.  CODE STATUS: DO NOT RESUSCITATE.   TIME SPENT: More than 30 minutes. ____________________________ Joann HammingSnehalatha Anni Hocevar, MD sk:slb D: 08/19/2011 08:47:46 ET T: 08/19/2011 10:10:56 ET JOB#: [id number]  cc: Joann HammingSnehalatha Kamisha Ell, MD, <Dictator> Joann HammingSNEHALATHA Maurico Perrell MD ELECTRONICALLY SIGNED 08/23/2011 9:58

## 2014-06-08 NOTE — Consult Note (Signed)
Brief Consult Note: Diagnosis: Parotidis.   Patient was seen by consultant.   Consult note dictated.   Comments: Parotidis resolving.  Recommend discharge with PO antibiotics for 10 days.  Augmentin is best if not PCN allergic.  Comes in Suspension.  Can adjust for weight.  Electronic Signatures: Davina PokeMcqueen, Kalyan Barabas T (MD)  (Signed 03-Jul-13 17:12)  Authored: Brief Consult Note   Last Updated: 03-Jul-13 17:12 by Davina PokeMcqueen, Aleesia Henney T (MD)

## 2014-06-08 NOTE — Consult Note (Signed)
PATIENT NAME:  Joann Mccoy, Joann C MR#:  [id number] DATE OF BIRTH:  18-Feb-1918  DATE OF CONSULTATION:  08/17/2011  REFERRING PHYSICIAN:  Dr. Allena KatzPatel  CONSULTING PHYSICIAN:  Davina Pokehapman T. Jessen Siegman, MD  CONSULTING PHYSICIAN: Parotitis.   HISTORY OF PRESENT ILLNESS: This is a 81 year old female with multiple medical problems who was in an assisted care living place, was brought to the hospital for altered mental and left parotitis. She was admitted on 07/02, had a CT scan that showed left parotitis. She was started on antibiotics and has significantly improved since that time. Her family is with her today and says they believe she is about 90% improved from yesterday.   PAST MEDICAL HISTORY:  1. Hypertension.  2. Hyperlipidemia.  3. Reflux.  4. Osteoporosis.  5. Diverticulitis.  6. Aortic insufficiency. 7. Hemorrhoids.   ALLERGIES: She has an allergy to sulfur.   PAST SURGICAL HISTORY:  1. Cataract surgery. 2. Septal surgery.  3. Breast biopsy. 4. Hernia repair. 5. Hysterectomy. 6. Tonsillectomy. 7. Hemorrhoidectomy. 8. Colonoscopy. 9. Polypectomy. 10. Right hip fracture in 2004.   FAMILY HISTORY: Noncontributory.   SOCIAL HISTORY: No alcohol or tobacco abuse.   MEDICATIONS: Multiple and listed in the chart.   CODE STATUS: DO NOT RESUSCITATE.   PHYSICAL EXAMINATION:  EARS: The external ears appeared normal.   NOSE: The anterior nose is patent.   ORAL CAVITY AND OROPHARYNX: Show moist mucous membranes. There was no evidence of obstruction of Stensen's duct. Floor of mouth was clear.   NECK: Palpation of the neck essentially normal exam today. There is no significant left parotid swelling. The right side is within normal limits as well. Possibly a little bit of fullness in the left carotid but no tenderness today.   LABORATORY, DIAGNOSTIC AND RADIOLOGICAL DATA: Laboratory data and CT scan reviewed.   IMPRESSION: Parotitis, appears to be resolving on IV antibiotics and hydration.  Would recommend upon discharge sending her home on Augmentin ES suspension 600 b.i.d. and amoxicillin 500 suspension t.i.d. for a total of 10 days.  ____________________________ Davina Pokehapman T. Radie Berges, MD ctm:cms D: 08/17/2011 17:14:39 ET T: 08/17/2011 17:30:25 ET JOB#: [id number]  cc: Davina Pokehapman T. Stephaun Million, MD, <Dictator>  Davina PokeHAPMAN T Clovis Mankins MD ELECTRONICALLY SIGNED 08/30/2011 7:15

## 2014-06-27 LAB — CBC AND DIFFERENTIAL
HCT: 32 % — AB (ref 36–46)
Hemoglobin: 11 g/dL — AB (ref 12.0–16.0)
Platelets: 156 10*3/uL (ref 150–399)
WBC: 4.3 10^3/mL

## 2014-06-27 LAB — BASIC METABOLIC PANEL
BUN: 14 mg/dL (ref 4–21)
Creatinine: 0.7 mg/dL (ref 0.5–1.1)
Glucose: 83 mg/dL
Potassium: 4.1 mmol/L (ref 3.4–5.3)
Sodium: 141 mmol/L (ref 137–147)

## 2014-06-27 LAB — HEPATIC FUNCTION PANEL
ALK PHOS: 49 U/L (ref 25–125)
AST: 11 U/L — AB (ref 13–35)
Bilirubin, Total: 0.3 mg/dL

## 2014-07-07 ENCOUNTER — Non-Acute Institutional Stay (SKILLED_NURSING_FACILITY): Payer: Medicare Other | Admitting: Internal Medicine

## 2014-07-07 DIAGNOSIS — E538 Deficiency of other specified B group vitamins: Secondary | ICD-10-CM

## 2014-07-07 DIAGNOSIS — M810 Age-related osteoporosis without current pathological fracture: Secondary | ICD-10-CM | POA: Insufficient documentation

## 2014-07-07 DIAGNOSIS — F0390 Unspecified dementia without behavioral disturbance: Secondary | ICD-10-CM | POA: Diagnosis not present

## 2014-07-07 DIAGNOSIS — K5901 Slow transit constipation: Secondary | ICD-10-CM | POA: Diagnosis not present

## 2014-07-07 DIAGNOSIS — K219 Gastro-esophageal reflux disease without esophagitis: Secondary | ICD-10-CM | POA: Diagnosis not present

## 2014-07-07 DIAGNOSIS — E46 Unspecified protein-calorie malnutrition: Secondary | ICD-10-CM | POA: Insufficient documentation

## 2014-07-07 DIAGNOSIS — I10 Essential (primary) hypertension: Secondary | ICD-10-CM

## 2014-07-07 DIAGNOSIS — M159 Polyosteoarthritis, unspecified: Secondary | ICD-10-CM

## 2014-07-07 DIAGNOSIS — F03C0 Unspecified dementia, severe, without behavioral disturbance, psychotic disturbance, mood disturbance, and anxiety: Secondary | ICD-10-CM | POA: Insufficient documentation

## 2014-07-07 NOTE — Progress Notes (Signed)
Patient ID: Joann Mccoy, female   DOB: 06/03/1918, 81 y.o.   MRN: [id number]      Facility: [facility]  Chief complaint: routine visit  Allergies- sulfa  Code: DNR  HPI 81 y/o female patient is seen for routine visit. She was under hospice services until last week. She is seen in her room today. She is in no distress, pleasantly confused and does not participate in HPI or ROS. Weight has been low- 3 lbs lower than last visit. No recent falls reported. No new skin concerns. Continues to get b12 injection monthly. Ate 100% of her breakfast this am. She is under total care. She has history of vascular dementia, HTN, gerd, constipation, allergic rhinitis among others.   ROS Unable to obtain from patient with her dementia   Needs assistance with bathing, dressing, toileting, transfers and eating.   Past Medical History  Diagnosis Date  . Hypertension   . Allergy   . Anxiety   . Depression   . Urinary incontinence   . Vitamin D deficiency   . Vitamin B12 deficiency   . Fall    Medication reviewed.    Medication List       This list is accurate as of: 07/07/14 10:32 AM.  Always use your most recent med list.               acetaminophen 325 MG tablet  Commonly known as:  TYLENOL  Take 650 mg by mouth 2 (two) times daily.     atenolol 25 MG tablet  Commonly known as:  TENORMIN  Take 25 mg by mouth daily.     cyanocobalamin 1000 MCG/ML injection  Commonly known as:  (VITAMIN B-12)  Inject 1,000 mcg into the muscle every 30 (thirty) days.     lisinopril 10 MG tablet  Commonly known as:  PRINIVIL,ZESTRIL  Take 20 mg by mouth daily.     ranitidine 150 MG tablet  Commonly known as:  ZANTAC  Take 1 tablet (150 mg total) by mouth at bedtime.     sennosides-docusate sodium 8.6-50 MG tablet  Commonly known as:  SENOKOT-S  Take 1 tablet by mouth daily.     Vitamin D 2000 UNITS Caps  Take 3,000 Units by mouth daily.         Physical exam BP 163/86 mmHg  Pulse 63  Temp(Src) 98.7 F (37.1 C)  Resp 18  Ht  (1.651 m)  Wt 89 lb 12.8 oz (40.733 kg)  BMI 14.94 kg/m2  SpO2 95%  Wt Readings from Last 3 Encounters:  07/07/14 89 lb 12.8 oz (40.733 kg)  06/06/14 92 lb (41.731 kg)  05/07/14 92 lb (41.731 kg)   General- elderly frail female in no acute distress. Alert but non verbal Head- atraumatic, normocephalic Eyes- no pallor, no icterus, no discharge Neck- no lymphadenopathy, no JVD Nose- normal nasal mucosa Mouth- normal mucus membrane Cardiovascular- normal s1,s2, no murmurs, no edema Respiratory- bilateral clear to auscultation, no wheeze, no rhonchi, no crackles Abdomen- bowel sounds present, soft, non tender Musculoskeletal- able to move all 4 extremities, out of bed to wheelchair, generalized weakness Skin- warm and dry Neurological- alert, non verbal, under total care Psychiatry- poor insight  Labs 12-03-12: wbc 4.6; hgb 12.6; hct 39.1; mcv 101.6; plt 175; glucose 103; bun 11; creat 0.7; k+4.0; na++136; liver normal albumin 3.8 03-08-13: vit b12: 186; vit d 20.11 4--20-15: wbc 4.5; hgb 11.7; ct 36.7; mcv 102.8; plt  169; glucose 81; bun 16; creat 0.7; k+4.2; na++140; liver normal albumin 3.4 06-07-13: glucose 98; bun 18; creat 0.6; k+3.9; na++ 139   07-01-13: vit b12: 236   10-10-13 b12 916, tsh 2.68 12-03-13 wbc 4.7, hb 11, hct 33.7, plt 148, na 138, k 3.9, bun 17, cr 0.6, glu 94, ca 9.1 02-24-14 wbc 5.6, hb 11.4, hct 35, plt 161, na 146, k 3.5, bun 25, cr 0.7   Assessment/plan  Advanced alzheimer's dementia Persists, decline anticipated. Under total care. Provide assistance with ADLs, fall precautions, pressure ulcer prophylaxis. Monitor po intake and weight  Protein calorie malnutrition Losing weight and is malnourished/ underweight. Ate everything in her breakfast tray this am. Monitor po intake.   Hypertension BP Readings from Last 3 Encounters:  07/07/14 163/86  06/06/14  132/60  05/07/14 120/70   Elevated bp reading. On atenolol 25 mg daily and lisinopril 20 mg daily. Monitor bp q shift and if > 3 reading > 150/90, adjsut bp med, can increase dose of atenolol  b12 deficiency Continue monthly b12 injection  gerd Stable, continue zantac 150 mg daily prn  Constipation Stable, continue senokot s  Senile osteoporosis On vit d 3000 u daily. Will continue this  OA Stable, continue current regimen of tylenol and monitor  Labs- cbc, cmp   Oneal GroutMAHIMA Kamir Selover, MD  [facility] [phone] (Monday-Friday 8 am - 5 pm) [phone] (afterhours)

## 2014-07-21 ENCOUNTER — Non-Acute Institutional Stay (SKILLED_NURSING_FACILITY): Payer: Medicare Other | Admitting: Internal Medicine

## 2014-07-21 ENCOUNTER — Non-Acute Institutional Stay (SKILLED_NURSING_FACILITY): Payer: Medicare Other

## 2014-07-21 DIAGNOSIS — K219 Gastro-esophageal reflux disease without esophagitis: Secondary | ICD-10-CM | POA: Diagnosis not present

## 2014-07-21 DIAGNOSIS — E46 Unspecified protein-calorie malnutrition: Secondary | ICD-10-CM

## 2014-07-21 DIAGNOSIS — I10 Essential (primary) hypertension: Secondary | ICD-10-CM

## 2014-07-21 DIAGNOSIS — J309 Allergic rhinitis, unspecified: Secondary | ICD-10-CM

## 2014-07-21 NOTE — Progress Notes (Signed)
Patient ID: Joann Mccoy, female   DOB: Sep 22, 1918, 81 y.o.   MRN: [id number]    [facility]  Code Status: DNR  Chief Complaint  Patient presents with  . Medical Management of Chronic Issues    Routine Visit    Allergies- sulfa  HPI 81 y/o female patient is seen for routine visit. She is seen in her room today. She is in no distress, pleasantly confused and does not participate in HPI or ROS. She is under total care. Reviewed bp readings, been elevated 146-171/66-72, weight has been stable, has gained 4 lbs She has history of vascular dementia, HTN, gerd, constipation, allergic rhinitis among others.   ROS Unable to obtain from patient with her dementia   Needs assistance with bathing, dressing, toileting, transfers and eating.   Past Medical History  Diagnosis Date  . Hypertension   . Allergy   . Anxiety   . Depression   . Urinary incontinence   . Vitamin D deficiency   . Vitamin B12 deficiency   . Fall    Medication reviewed.    Medication List       This list is accurate as of: 07/21/14  5:30 PM.  Always use your most recent med list.               acetaminophen 325 MG tablet  Commonly known as:  TYLENOL  Take 650 mg by mouth 2 (two) times daily.     AMBULATORY NON FORMULARY MEDICATION  Medication Name: Med Pass 120 mL by mouth two times daily r/t weight loss. Document amount consumed     atenolol 25 MG tablet  Commonly known as:  TENORMIN  Take 25 mg by mouth daily.     cholecalciferol 1000 UNITS tablet  Commonly known as:  VITAMIN D  Take 3,000 Units by mouth daily.     cyanocobalamin 1000 MCG/ML injection  Commonly known as:  (VITAMIN B-12)  Inject 1,000 mcg into the muscle every 30 (thirty) days.     ipratropium-albuterol 0.5-2.5 (3) MG/3ML Soln  Commonly known as:  DUONEB  Take 3 mLs by nebulization every 6 (six) hours as needed.     lisinopril 10 MG tablet  Commonly known as:  PRINIVIL,ZESTRIL  Take 20 mg by  mouth daily.     Polyvinyl Alcohol-Povidone 2.7-2 % Soln  Apply 1 drop to eye 2 (two) times daily. Each eye, wait 3-5 minutes between 3 eye medications     sennosides-docusate sodium 8.6-50 MG tablet  Commonly known as:  SENOKOT-S  Take 1 tablet by mouth daily.     ZANTAC 150 MG tablet  Generic drug:  ranitidine  Take 1 tablet (150 mg total) by mouth daily as needed for heartburn.        Physical exam BP 182/60 mmHg  Pulse 69  Temp(Src) 97 F (36.1 C) (Oral)  Resp 18  Ht  (1.651 m)  Wt 93 lb 3.2 oz (42.275 kg)  BMI 15.51 kg/m2  SpO2 96%  Wt Readings from Last 3 Encounters:  07/21/14 93 lb 3.2 oz (42.275 kg)  07/07/14 89 lb 12.8 oz (40.733 kg)  06/06/14 92 lb (41.731 kg)   General- elderly frail female in no acute distress. Alert but non verbal Head- atraumatic, normocephalic Eyes- no pallor, no icterus, no discharge Neck- no lymphadenopathy, no JVD Nose- normal nasal mucosa Mouth- normal mucus membrane Cardiovascular- normal s1,s2, no murmurs, no edema Respiratory- bilateral clear to auscultation, no  wheeze, no rhonchi, no crackles Abdomen- bowel sounds present, soft, non tender Musculoskeletal- able to move all 4 extremities, out of bed to wheelchair, generalized weakness Skin- warm and dry Neurological- alert, non verbal, under total care Psychiatry- poor insight  Labs 12-03-12: wbc 4.6; hgb 12.6; hct 39.1; mcv 101.6; plt 175; glucose 103; bun 11; creat 0.7; k+4.0; na++136; liver normal albumin 3.8 03-08-13: vit b12: 186; vit d 20.11 4--20-15: wbc 4.5; hgb 11.7; ct 36.7; mcv 102.8; plt 169; glucose 81; bun 16; creat 0.7; k+4.2; na++140; liver normal albumin 3.4 06-07-13: glucose 98; bun 18; creat 0.6; k+3.9; na++ 139   07-01-13: vit b12: 236   10-10-13 b12 916, tsh 2.68 12-03-13 wbc 4.7, hb 11, hct 33.7, plt 148, na 138, k 3.9, bun 17, cr 0.6, glu 94, ca 9.1 02-24-14 wbc 5.6, hb 11.4, hct 35, plt 161, na 146, k 3.5, bun 25, cr 0.7 06-27-14 wbc 4.3, hb 11, hct  32.3, plt 156, na 141, k 4.1, bun 14, cr 0.66, alb 3.2   Assessment/plan  Protein calorie malnutrition Has gained few pounds with weight being stable.  Continue assistance with feeding. Continue medpass supplement  Hypertension BP Readings from Last 3 Encounters:  07/21/14 182/60  07/07/14 163/86  06/06/14 132/60   Elevated bp reading. On atenolol 25 mg daily and lisinopril 20 mg daily. Increase lisinopril to 40 mg daily for now and reassess. Monitor bp daily for now  gerd Stable, continue zantac 150 mg daily prn  Allergic rhinitis Currently asymptomatic, off flonase, monitor clinically   Oneal GroutMAHIMA Amanada Philbrick, MD  [facility] [phone] (Monday-Friday 8 am - 5 pm) [phone] (afterhours)

## 2014-07-23 ENCOUNTER — Encounter: Payer: Self-pay | Admitting: Internal Medicine

## 2014-07-23 NOTE — Progress Notes (Signed)
Patient ID: Joann Mccoy, female   DOB: 20-May-1918, 81 y.o.   MRN: [id number] Opened in error

## 2014-07-23 NOTE — Progress Notes (Signed)
Patient ID: Joann Mccoy, female   DOB: [birth year], 81 y.o.   MRN: [id number]     [facility]  Code Status: DNR  Chief Complaint  Patient presents with  . Medical Management of Chronic Issues    Routine Visit    Allergies- sulfa  HPI 81 y/o female patient is seen for routine visit. She is seen in her room today. She is in no distress, pleasantly confused and does not participate in HPI or ROS. She is under total care. Reviewed bp readings, been elevated 146-171/66-72, weight has been stable, has gained 4 lbs She has history of vascular dementia, HTN, gerd, constipation, allergic rhinitis among others.   ROS Unable to obtain from patient with her dementia  Needs assistance with bathing, dressing, toileting, transfers and eating  Past Medical History  Diagnosis Date  . Hypertension   . Allergy   . Anxiety   . Depression   . Urinary incontinence   . Vitamin D deficiency   . Vitamin B12 deficiency   . Fall      Medication List       This list is accurate as of: 07/21/14 11:59 PM.  Always use your most recent med list.               acetaminophen 325 MG tablet  Commonly known as:  TYLENOL  Take 650 mg by mouth 2 (two) times daily.     AMBULATORY NON FORMULARY MEDICATION  Medication Name: Med Pass 120 mL by mouth two times daily r/t weight loss. Document amount consumed     atenolol 25 MG tablet  Commonly known as:  TENORMIN  Take 25 mg by mouth daily.     cholecalciferol 1000 UNITS tablet  Commonly known as:  VITAMIN D  Take 3,000 Units by mouth daily.     cyanocobalamin 1000 MCG/ML injection  Commonly known as:  (VITAMIN B-12)  Inject 1,000 mcg into the muscle every 30 (thirty) days.     ipratropium-albuterol 0.5-2.5 (3) MG/3ML Soln  Commonly known as:  DUONEB  Take 3 mLs by nebulization every 6 (six) hours as needed.     lisinopril 10 MG tablet  Commonly known as:  PRINIVIL,ZESTRIL  Take 20 mg by mouth daily.     Polyvinyl Alcohol-Povidone 2.7-2 % Soln  Apply 1 drop to eye 2 (two) times daily. Each eye, wait 3-5 minutes between 3 eye medications     sennosides-docusate sodium 8.6-50 MG tablet  Commonly known as:  SENOKOT-S  Take 1 tablet by mouth daily.     ZANTAC 150 MG tablet  Generic drug:  ranitidine  Take 1 tablet (150 mg total) by mouth daily as needed for heartburn.        Physical exam BP 182/60 mmHg  Pulse 69  Temp(Src) 97 F (36.1 C) (Oral)  Resp 18  Ht  (1.651 m)  Wt 93 lb (42.185 kg)  BMI 15.48 kg/m2  Wt Readings from Last 3 Encounters:  07/21/14 93 lb 3.2 oz (42.275 kg)  07/22/14 93 lb (42.185 kg)  07/07/14 89 lb 12.8 oz (40.733 kg)   General- elderly frail female in no acute distress. Alert but non verbal Head- atraumatic, normocephalic Eyes- no pallor, no icterus, no discharge Neck- no lymphadenopathy, no JVD Nose- normal nasal mucosa Mouth- normal mucus membrane Cardiovascular- normal s1,s2, no murmurs, no edema Respiratory- bilateral clear to auscultation, no wheeze, no rhonchi, no crackles Abdomen- bowel sounds present, soft, non  tender Musculoskeletal- able to move all 4 extremities, out of bed to wheelchair, generalized weakness Skin- warm and dry Neurological- alert, non verbal, under total care Psychiatry- poor insight  Labs 12-03-12: wbc 4.6; hgb 12.6; hct 39.1; mcv 101.6; plt 175; glucose 103; bun 11; creat 0.7; k+4.0; na++136; liver normal albumin 3.8 03-08-13: vit b12: 186; vit d 20.11 4--20-15: wbc 4.5; hgb 11.7; ct 36.7; mcv 102.8; plt 169; glucose 81; bun 16; creat 0.7; k+4.2; na++140; liver normal albumin 3.4 06-07-13: glucose 98; bun 18; creat 0.6; k+3.9; na++ 139  07-01-13: vit b12: 236  10-10-13 b12 916, tsh 2.68 12-03-13 wbc 4.7, hb 11, hct 33.7, plt 148, na 138, k 3.9, bun 17, cr 0.6, glu 94, ca 9.1 02-24-14 wbc 5.6, hb 11.4, hct 35, plt 161, na 146, k 3.5, bun 25, cr 0.7 06-27-14 wbc 4.3, hb 11, hct 32.3, plt 156, na 141, k 4.1, bun 14,  cr 0.66, alb 3.2   Assessment/plan  Protein calorie malnutrition Has gained few pounds with weight being stable. Continue assistance with feeding. Continue medpass supplement  HTN elevated bp reading. On atenolol 25 mg daily and lisinopril 20 mg daily. Increase lisinopril to 40 mg daily for now and reassess. Monitor bp daily for now  gerd Stable, continue zantac 150 mg daily prn  Allergic rhinitis Currently asymptomatic, off flonase, monitor clinically  Oneal GroutMAHIMA Idaliz Tinkle, MD  [facility] [phone] (Monday-Friday 8 am - 5 pm) [phone] (afterhours)

## 2014-09-02 ENCOUNTER — Non-Acute Institutional Stay (SKILLED_NURSING_FACILITY): Payer: Medicare Other | Admitting: Internal Medicine

## 2014-09-02 DIAGNOSIS — N189 Chronic kidney disease, unspecified: Secondary | ICD-10-CM | POA: Diagnosis not present

## 2014-09-02 DIAGNOSIS — H04123 Dry eye syndrome of bilateral lacrimal glands: Secondary | ICD-10-CM

## 2014-09-02 DIAGNOSIS — I1310 Hypertensive heart and chronic kidney disease without heart failure, with stage 1 through stage 4 chronic kidney disease, or unspecified chronic kidney disease: Secondary | ICD-10-CM | POA: Diagnosis not present

## 2014-09-02 DIAGNOSIS — H04129 Dry eye syndrome of unspecified lacrimal gland: Secondary | ICD-10-CM | POA: Insufficient documentation

## 2014-09-02 NOTE — Progress Notes (Signed)
Patient ID: Joann Mccoy, female   DOB: 1918-07-25, 81 y.o.   MRN: [id number]     [facility]  Code Status: DNR  Chief Complaint  Patient presents with  . Medical Management of Chronic Issues   Allergies- sulfa  HPI 81 y/o female patient is seen for routine visit. bp reading stable on review, tolerating increased dosing of lisinopril well. Weight stable. Pt not participating in hpi or ROS. No new concern from staff.  She is in no distress, pleasantly confused. She has history of vascular dementia, HTN, gerd, constipation, allergic rhinitis among others.   ROS Unable to obtain from patient with her dementia  Needs assistance with bathing, dressing, toileting, transfers and eating  Past Medical History  Diagnosis Date  . Hypertension   . Allergy   . Anxiety   . Depression   . Urinary incontinence   . Vitamin D deficiency   . Vitamin B12 deficiency   . Fall      Medication List       This list is accurate as of: 09/02/14  4:50 PM.  Always use your most recent med list.               acetaminophen 325 MG tablet  Commonly known as:  TYLENOL  Take 650 mg by mouth 2 (two) times daily.     AMBULATORY NON FORMULARY MEDICATION  Medication Name: Med Pass 120 mL by mouth two times daily r/t weight loss. Document amount consumed     atenolol 25 MG tablet  Commonly known as:  TENORMIN  Take 25 mg by mouth daily.     cholecalciferol 1000 UNITS tablet  Commonly known as:  VITAMIN D  Take 3,000 Units by mouth daily.     cyanocobalamin 1000 MCG/ML injection  Commonly known as:  (VITAMIN B-12)  Inject 1,000 mcg into the muscle every 30 (thirty) days.     ipratropium-albuterol 0.5-2.5 (3) MG/3ML Soln  Commonly known as:  DUONEB  Take 3 mLs by nebulization every 6 (six) hours as needed.     lisinopril 10 MG tablet  Commonly known as:  PRINIVIL,ZESTRIL  Take 40 mg by mouth daily.     Polyvinyl Alcohol-Povidone 2.7-2 % Soln  Apply 1 drop  to eye 2 (two) times daily. Each eye, wait 3-5 minutes between 3 eye medications     sennosides-docusate sodium 8.6-50 MG tablet  Commonly known as:  SENOKOT-S  Take 1 tablet by mouth daily.     ZANTAC 150 MG tablet  Generic drug:  ranitidine  Take 1 tablet (150 mg total) by mouth daily as needed for heartburn.        Physical exam BP 140/63 mmHg  Pulse 88  Temp(Src) 98.2 F (36.8 C)  Resp 18  Wt 94 lb (42.638 kg)  SpO2 98%  Wt Readings from Last 3 Encounters:  09/02/14 94 lb (42.638 kg)  07/21/14 93 lb 3.2 oz (42.275 kg)  07/22/14 93 lb (42.185 kg)   General- elderly frail female in no acute distress. Alert but non verbal Head- atraumatic, normocephalic Eyes- no pallor, no icterus, no discharge Neck- no lymphadenopathy, no JVD Nose- normal nasal mucosa Mouth- normal mucus membrane Cardiovascular- normal s1,s2, no murmurs, no edema Respiratory- bilateral clear to auscultation, no wheeze, no rhonchi, no crackles Abdomen- bowel sounds present, soft, non tender Musculoskeletal- able to move all 4 extremities, out of bed to wheelchair, generalized weakness Skin- warm and dry Neurological- alert, non  verbal, under total care Psychiatry- poor insight  Labs 12-03-12: wbc 4.6; hgb 12.6; hct 39.1; mcv 101.6; plt 175; glucose 103; bun 11; creat 0.7; k+4.0; na++136; liver normal albumin 3.8 03-08-13: vit b12: 186; vit d 20.11 4--20-15: wbc 4.5; hgb 11.7; ct 36.7; mcv 102.8; plt 169; glucose 81; bun 16; creat 0.7; k+4.2; na++140; liver normal albumin 3.4 06-07-13: glucose 98; bun 18; creat 0.6; k+3.9; na++ 139  07-01-13: vit b12: 236  10-10-13 b12 916, tsh 2.68 12-03-13 wbc 4.7, hb 11, hct 33.7, plt 148, na 138, k 3.9, bun 17, cr 0.6, glu 94, ca 9.1 02-24-14 wbc 5.6, hb 11.4, hct 35, plt 161, na 146, k 3.5, bun 25, cr 0.7 06-27-14 wbc 4.3, hb 11, hct 32.3, plt 156, na 141, k 4.1, bun 14, cr 0.66, alb 3.2   Assessment/plan  ckd  Stable, continue zestril 40 mg daily for now, gfr  stable  Hypertensive heart and renal disease Stable bp reading. Continue tenormin and zestril. Monitor bp readings  Dry eye Stable on freshkote, monitor  Joann Grout, MD  [facility] [phone] (Monday-Friday 8 am - 5 pm) [phone] (afterhours)

## 2014-10-07 ENCOUNTER — Non-Acute Institutional Stay (SKILLED_NURSING_FACILITY): Payer: Medicare Other | Admitting: Internal Medicine

## 2014-10-07 DIAGNOSIS — F329 Major depressive disorder, single episode, unspecified: Secondary | ICD-10-CM

## 2014-10-07 DIAGNOSIS — F0153 Vascular dementia, unspecified severity, with mood disturbance: Secondary | ICD-10-CM

## 2014-10-07 DIAGNOSIS — E46 Unspecified protein-calorie malnutrition: Secondary | ICD-10-CM

## 2014-10-07 DIAGNOSIS — K219 Gastro-esophageal reflux disease without esophagitis: Secondary | ICD-10-CM | POA: Diagnosis not present

## 2014-10-07 DIAGNOSIS — F0151 Vascular dementia with behavioral disturbance: Secondary | ICD-10-CM | POA: Diagnosis not present

## 2014-10-07 DIAGNOSIS — F0150 Vascular dementia without behavioral disturbance: Secondary | ICD-10-CM

## 2014-10-07 NOTE — Progress Notes (Signed)
Patient ID: Joann Mccoy, female   DOB: 1919/01/19, 81 y.o.   MRN: [id number]     [facility]  Code Status: DNR  Chief Complaint  Patient presents with  . Medical Management of Chronic Issues   Allergies- sulfa  HPI 81 y/o female patient is seen for routine visit. She has been at her baseline, is not participating in hpi or ROS. No new concern from staff.    ROS Unable to obtain from patient with her dementia  Needs assistance with bathing, dressing, toileting, transfers and eating  Past Medical History  Diagnosis Date  . Hypertension   . Allergy   . Anxiety   . Depression   . Urinary incontinence   . Vitamin D deficiency   . Vitamin B12 deficiency   . Fall      Medication List       This list is accurate as of: 10/07/14  4:28 PM.  Always use your most recent med list.               acetaminophen 325 MG tablet  Commonly known as:  TYLENOL  Take 650 mg by mouth 2 (two) times daily.     AMBULATORY NON FORMULARY MEDICATION  Medication Name: Med Pass 120 mL by mouth two times daily r/t weight loss. Document amount consumed     atenolol 25 MG tablet  Commonly known as:  TENORMIN  Take 25 mg by mouth daily.     cholecalciferol 1000 UNITS tablet  Commonly known as:  VITAMIN D  Take 3,000 Units by mouth daily.     cyanocobalamin 1000 MCG/ML injection  Commonly known as:  (VITAMIN B-12)  Inject 1,000 mcg into the muscle every 30 (thirty) days.     ipratropium-albuterol 0.5-2.5 (3) MG/3ML Soln  Commonly known as:  DUONEB  Take 3 mLs by nebulization every 6 (six) hours as needed.     lisinopril 10 MG tablet  Commonly known as:  PRINIVIL,ZESTRIL  Take 40 mg by mouth daily.     Polyvinyl Alcohol-Povidone 2.7-2 % Soln  Apply 1 drop to eye 2 (two) times daily. Each eye, wait 3-5 minutes between 3 eye medications     sennosides-docusate sodium 8.6-50 MG tablet  Commonly known as:  SENOKOT-S  Take 1 tablet by mouth daily.          Physical exam BP 130/76 mmHg  Pulse 76  Temp(Src) 98 F (36.7 C)  Resp 16  Wt 94 lb 4.8 oz (42.774 kg)  Wt Readings from Last 3 Encounters:  10/07/14 94 lb 4.8 oz (42.774 kg)  09/02/14 94 lb (42.638 kg)  07/21/14 93 lb 3.2 oz (42.275 kg)   General- elderly frail female in no acute distress. Alert but non verbal Head- atraumatic, normocephalic Eyes- no pallor, no icterus, no discharge Neck- no lymphadenopathy, no JVD Cardiovascular- normal s1,s2, no murmurs, no edema Respiratory- bilateral clear to auscultation, no wheeze, no rhonchi, no crackles Abdomen- bowel sounds present, soft, non tender Musculoskeletal- able to move all 4 extremities, generalized weakness Skin- warm and dry Neurological- alert, non verbal, under total care  Labs 03-08-13: vit b12: 186; vit d 20.11 4--20-15: wbc 4.5; hgb 11.7; ct 36.7; mcv 102.8; plt 169; glucose 81; bun 16; creat 0.7; k+4.2; na++140; liver normal albumin 3.4 06-07-13: glucose 98; bun 18; creat 0.6; k+3.9; na++ 139  07-01-13: vit b12: 236  10-10-13 b12 916, tsh 2.68 12-03-13 wbc 4.7, hb 11, hct 33.7, plt  148, na 138, k 3.9, bun 17, cr 0.6, glu 94, ca 9.1 02-24-14 wbc 5.6, hb 11.4, hct 35, plt 161, na 146, k 3.5, bun 25, cr 0.7 06-27-14 wbc 4.3, hb 11, hct 32.3, plt 156, na 141, k 4.1, bun 14, cr 0.66, alb 3.2   Assessment/plan  gerd Stable, off zantac, monitor clinically  Protein calorie malnutrition Low weight, decline anticipated with her advanced dementia. Monitor weight, feeding from staff. Continue medpass and puree diet  Vascular dementia Advanced, decline anticipated, low weight, continue total care. Monitor weight. Pressure ulcer prophylaxis. No pEG tube desired by family   Oneal Grout, MD  [facility] [phone] (Monday-Friday 8 am - 5 pm) [phone] (afterhours)

## 2014-11-04 ENCOUNTER — Non-Acute Institutional Stay (SKILLED_NURSING_FACILITY): Payer: Medicare Other | Admitting: Internal Medicine

## 2014-11-04 DIAGNOSIS — Z049 Encounter for examination and observation for unspecified reason: Secondary | ICD-10-CM

## 2014-11-04 DIAGNOSIS — F458 Other somatoform disorders: Secondary | ICD-10-CM

## 2014-11-04 DIAGNOSIS — H04123 Dry eye syndrome of bilateral lacrimal glands: Secondary | ICD-10-CM

## 2014-11-04 DIAGNOSIS — R6889 Other general symptoms and signs: Secondary | ICD-10-CM | POA: Insufficient documentation

## 2014-11-04 DIAGNOSIS — R1319 Other dysphagia: Secondary | ICD-10-CM | POA: Insufficient documentation

## 2014-11-04 DIAGNOSIS — I10 Essential (primary) hypertension: Secondary | ICD-10-CM

## 2014-11-04 NOTE — Progress Notes (Signed)
Patient ID: Joann Mccoy, female   DOB: 1918-05-17, 81 y.o.   MRN: [id number]     [facility] - [facility]  Code Status: DNR  Chief Complaint  Patient presents with  . Medical Management of Chronic Issues   Allergies- sulfa  HPI 81y/o female patient is seen for routine visit. She has been at her baseline, is not participating in hpi or ROS. No new concern from staff. She is out of bed daily. [birth year] yesterday. No falls reported. Has developed SDTI to coccyx.  ROS Unable to obtain from patient with her dementia  Needs assistance with bathing, dressing, toileting, transfers and eating  Past Medical History  Diagnosis Date  . Hypertension   . Allergy   . Anxiety   . Depression   . Urinary incontinence   . Vitamin D deficiency   . Vitamin B12 deficiency   . Fall      Medication List       This list is accurate as of: 11/04/14  5:01 PM.  Always use your most recent med list.               acetaminophen 325 MG tablet  Commonly known as:  TYLENOL  Take 650 mg by mouth 2 (two) times daily.     AMBULATORY NON FORMULARY MEDICATION  Medication Name: Med Pass 120 mL by mouth two times daily r/t weight loss. Document amount consumed     atenolol 25 MG tablet  Commonly known as:  TENORMIN  Take 25 mg by mouth daily.     cholecalciferol 1000 UNITS tablet  Commonly known as:  VITAMIN D  Take 3,000 Units by mouth daily.     cyanocobalamin 1000 MCG/ML injection  Commonly known as:  (VITAMIN B-12)  Inject 1,000 mcg into the muscle every 30 (thirty) days.     ipratropium-albuterol 0.5-2.5 (3) MG/3ML Soln  Commonly known as:  DUONEB  Take 3 mLs by nebulization every 6 (six) hours as needed.     lisinopril 10 MG tablet  Commonly known as:  PRINIVIL,ZESTRIL  Take 40 mg by mouth daily.     Polyvinyl Alcohol-Povidone 2.7-2 % Soln  Apply 1 drop to eye 2 (two) times daily. Each eye, wait 3-5 minutes between 3 eye medications     sennosides-docusate sodium 8.6-50 MG tablet  Commonly known as:  SENOKOT-S  Take 1 tablet by mouth daily.        Physical exam BP 151/68 mmHg  Pulse 71  Temp(Src) 98.2 F (36.8 C)  Resp 18  Wt 91 lb 6.4 oz (41.459 kg)  SpO2 96%  Wt Readings from Last 3 Encounters:  11/04/14 91 lb 6.4 oz (41.459 kg)  10/07/14 94 lb 4.8 oz (42.774 kg)  09/02/14 94 lb (42.638 kg)   General- elderly frail female in no acute distress, non verbal Head- atraumatic, normocephalic Eyes- no pallor, no icterus, no discharge Neck- no lymphadenopathy, no JVD Cardiovascular- normal s1,s2, no murmurs, no edema Respiratory- bilateral clear to auscultation, no wheeze, no rhonchi, no crackles Abdomen- bowel sounds present, soft, non tender Musculoskeletal- able to move all 4 extremities, generalized weakness Skin- warm and dry, SDTI to coccyx Neurological- alert, non verbal, under total care  Labs 03-08-13: vit b12: 186; vit d 20.11 4--20-15: wbc 4.5; hgb 11.7; ct 36.7; mcv 102.8; plt 169; glucose 81; bun 16; creat 0.7; k+4.2; na++140; liver normal albumin 3.4 06-07-13: glucose 98; bun 18; creat 0.6; k+3.9; na++ 139  07-01-13: vit b12: 236  10-10-13 b12 916, tsh 2.68 12-03-13 wbc 4.7, hb 11, hct 33.7, plt 148, na 138, k 3.9, bun 17, cr 0.6, glu 94, ca 9.1 02-24-14 wbc 5.6, hb 11.4, hct 35, plt 161, na 146, k 3.5, bun 25, cr 0.7 06-27-14 wbc 4.3, hb 11, hct 32.3, plt 156, na 141, k 4.1, bun 14, cr 0.66, alb 3.2   Assessment/plan  Suspected deep tissue injury to coccyx Clean area with normal saline and apply skin prep and cover with silicone dressing every 3 days, pressure ulcer prophylaxis. Gel overlay mattress for pressure ulcer prevention with decreased mobility  dysphagia with dementia Continue pureed food and to provide feeding assistance, aspiration precautions. gerd Stable, off zantac, monitor clinically  HTN Elevated bp reading today. Currently on atenolol 25 mg daily and lisinopril 40 mg daily.  With her age and co-morbidities, bp goa < 150/90. Monitor bp daily and if > 3 readings > 150/90, adjust regimen  Dry eyes With freshkote stable, continue this and monitor  Oneal Grout, MD  [facility] [phone] (Monday-Friday 8 am - 5 pm) [phone] (afterhours)

## 2014-12-24 ENCOUNTER — Non-Acute Institutional Stay (SKILLED_NURSING_FACILITY): Payer: Medicare Other | Admitting: Internal Medicine

## 2014-12-24 DIAGNOSIS — L89510 Pressure ulcer of right ankle, unstageable: Secondary | ICD-10-CM | POA: Diagnosis not present

## 2014-12-24 DIAGNOSIS — E46 Unspecified protein-calorie malnutrition: Secondary | ICD-10-CM

## 2014-12-24 DIAGNOSIS — I10 Essential (primary) hypertension: Secondary | ICD-10-CM

## 2014-12-24 DIAGNOSIS — K219 Gastro-esophageal reflux disease without esophagitis: Secondary | ICD-10-CM

## 2014-12-24 DIAGNOSIS — E538 Deficiency of other specified B group vitamins: Secondary | ICD-10-CM | POA: Diagnosis not present

## 2014-12-24 NOTE — Progress Notes (Signed)
Patient ID: Joann Mccoy, female   DOB: Jan 01, 1919, 81 y.o.   MRN: [id number]     [facility]  Code Status: DNR  Chief Complaint  Patient presents with  . Medical Management of Chronic Issues   Allergies- sulfa  HPI 81 y/o female patient is seen for routine visit. She has unstageable ulcer to right lateral malleolus and is getting wound care. She is getting roxanol prior to dressing change and tolerating it well. She has been at her baseline, is not participating in hpi or ROS. No new concern from staff.   ROS Unable to obtain from patient with her dementia  Needs assistance with bathing, dressing, toileting, transfers and eating  Past Medical History  Diagnosis Date  . Hypertension   . Allergy   . Anxiety   . Depression   . Urinary incontinence   . Vitamin D deficiency   . Vitamin B12 deficiency   . Fall      Medication List       This list is accurate as of: 12/24/14  4:21 PM.  Always use your most recent med list.               acetaminophen 325 MG tablet  Commonly known as:  TYLENOL  Take 650 mg by mouth 2 (two) times daily.     AMBULATORY NON FORMULARY MEDICATION  Medication Name: Med Pass 120 mL by mouth two times daily r/t weight loss. Document amount consumed     atenolol 25 MG tablet  Commonly known as:  TENORMIN  Take 25 mg by mouth daily.     cholecalciferol 1000 UNITS tablet  Commonly known as:  VITAMIN D  Take 3,000 Units by mouth daily.     cyanocobalamin 1000 MCG/ML injection  Commonly known as:  (VITAMIN B-12)  Inject 1,000 mcg into the muscle every 30 (thirty) days.     ipratropium-albuterol 0.5-2.5 (3) MG/3ML Soln  Commonly known as:  DUONEB  Take 3 mLs by nebulization every 6 (six) hours as needed.     lisinopril 10 MG tablet  Commonly known as:  PRINIVIL,ZESTRIL  Take 40 mg by mouth daily.     morphine 20 MG/ML concentrated solution  Commonly known as:  ROXANOL  Take 5 mg by mouth daily as needed  for severe pain (prior to dressing change).     Polyvinyl Alcohol-Povidone 2.7-2 % Soln  Apply 1 drop to eye 2 (two) times daily. Each eye, wait 3-5 minutes between 3 eye medications     sennosides-docusate sodium 8.6-50 MG tablet  Commonly known as:  SENOKOT-S  Take 1 tablet by mouth daily.        Physical exam BP 164/57 mmHg  Pulse 72  Temp(Src) 98.8 F (37.1 C)  Resp 16  SpO2 97%  Wt Readings from Last 3 Encounters:  11/04/14 91 lb 6.4 oz (41.459 kg)  10/07/14 94 lb 4.8 oz (42.774 kg)  09/02/14 94 lb (42.638 kg)   General- elderly frail female in no acute distress, non verbal Head- atraumatic, normocephalic Eyes- no pallor, no icterus, no discharge Neck- no lymphadenopathy, no JVD Cardiovascular- normal s1,s2, no murmurs, no edema Respiratory- bilateral clear to auscultation, no wheeze, no rhonchi, no crackles Abdomen- bowel sounds present, soft, non tender Musculoskeletal- able to move all 4 extremities, generalized weakness Skin- warm and dry, unstageable right lateral malleolus ulcer Neurological- non verbal, under total care  Labs 12-03-13 wbc 4.7, hb 11, hct 33.7, plt  148, na 138, k 3.9, bun 17, cr 0.6, glu 94, ca 9.1 02-24-14 wbc 5.6, hb 11.4, hct 35, plt 161, na 146, k 3.5, bun 25, cr 0.7 06-27-14 wbc 4.3, hb 11, hct 32.3, plt 156, na 141, k 4.1, bun 14, cr 0.66, alb 3.2   Assessment/plan  Right ankle pressure ulcer Continue wound care, pressure ulcer prophylaxis  Protein calorie malnutrition Severe and decline anticipated with her advanced dementia. Continue puree diet, assistance with feeding, medpass supplement and pressure ulcer prophyalxis. Monitor weight  gerd Stable, off medication for now, monitor  b12 deficiency Continue monthly b12 injection  HTN Elevated bp reading today. Currently on atenolol 25 mg daily and lisinopril 40 mg daily. With her age and co-morbidities, bp goa < 150/90. Increase atenolol to 37.5 mg daily and monitor   Oneal Grout, MD  [facility] [phone] (Monday-Friday 8 am - 5 pm) [phone] (afterhours)

## 2015-01-15 DEATH — deceased
